# Patient Record
Sex: Female | Born: 1965 | Race: Black or African American | Hispanic: No | Smoking: Never smoker
Health system: Southern US, Community
[De-identification: ages and names within clinical notes are randomized; demographics above are authoritative.]

## PROBLEM LIST (undated history)

## (undated) DIAGNOSIS — E785 Hyperlipidemia, unspecified: Secondary | ICD-10-CM

## (undated) DIAGNOSIS — M25512 Pain in left shoulder: Secondary | ICD-10-CM

## (undated) DIAGNOSIS — M25361 Other instability, right knee: Secondary | ICD-10-CM

## (undated) DIAGNOSIS — E669 Obesity, unspecified: Secondary | ICD-10-CM

## (undated) DIAGNOSIS — I10 Essential (primary) hypertension: Secondary | ICD-10-CM

## (undated) DIAGNOSIS — T7840XA Allergy, unspecified, initial encounter: Secondary | ICD-10-CM

## (undated) DIAGNOSIS — M25362 Other instability, left knee: Secondary | ICD-10-CM

## (undated) DIAGNOSIS — J302 Other seasonal allergic rhinitis: Secondary | ICD-10-CM

## (undated) DIAGNOSIS — K219 Gastro-esophageal reflux disease without esophagitis: Secondary | ICD-10-CM

## (undated) DIAGNOSIS — J329 Chronic sinusitis, unspecified: Secondary | ICD-10-CM

## (undated) DIAGNOSIS — M25511 Pain in right shoulder: Secondary | ICD-10-CM

## (undated) HISTORY — DX: Allergy, unspecified, initial encounter: T78.40XA

## (undated) HISTORY — DX: Other instability, left knee: M25.362

## (undated) HISTORY — DX: Essential (primary) hypertension: I10

## (undated) HISTORY — DX: Hyperlipidemia, unspecified: E78.5

## (undated) HISTORY — DX: Pain in right shoulder: M25.511

## (undated) HISTORY — DX: Other seasonal allergic rhinitis: J30.2

## (undated) HISTORY — DX: Pain in left shoulder: M25.512

## (undated) HISTORY — DX: Obesity, unspecified: E66.9

## (undated) HISTORY — DX: Gastro-esophageal reflux disease without esophagitis: K21.9

## (undated) HISTORY — DX: Other instability, right knee: M25.361

---

## 1991-10-16 HISTORY — PX: TUBAL LIGATION: SHX77

## 1999-10-16 HISTORY — PX: CHOLECYSTECTOMY: SHX55

## 2015-03-08 ENCOUNTER — Encounter (HOSPITAL_COMMUNITY): Payer: Self-pay | Admitting: *Deleted

## 2015-03-08 ENCOUNTER — Emergency Department (HOSPITAL_COMMUNITY): Payer: No Typology Code available for payment source

## 2015-03-08 ENCOUNTER — Emergency Department (HOSPITAL_COMMUNITY)
Admission: EM | Admit: 2015-03-08 | Discharge: 2015-03-08 | Disposition: A | Discharge status: Home / Self Care | Payer: No Typology Code available for payment source | Attending: Emergency Medicine | Admitting: Emergency Medicine

## 2015-03-08 DIAGNOSIS — T148 Other injury of unspecified body region: Secondary | ICD-10-CM | POA: Diagnosis not present

## 2015-03-08 DIAGNOSIS — R52 Pain, unspecified: Secondary | ICD-10-CM

## 2015-03-08 DIAGNOSIS — Y9389 Activity, other specified: Secondary | ICD-10-CM | POA: Insufficient documentation

## 2015-03-08 DIAGNOSIS — S0990XA Unspecified injury of head, initial encounter: Secondary | ICD-10-CM | POA: Insufficient documentation

## 2015-03-08 DIAGNOSIS — M25551 Pain in right hip: Secondary | ICD-10-CM | POA: Diagnosis not present

## 2015-03-08 DIAGNOSIS — Y998 Other external cause status: Secondary | ICD-10-CM | POA: Insufficient documentation

## 2015-03-08 DIAGNOSIS — Y92410 Unspecified street and highway as the place of occurrence of the external cause: Secondary | ICD-10-CM | POA: Diagnosis not present

## 2015-03-08 DIAGNOSIS — T148XXA Other injury of unspecified body region, initial encounter: Secondary | ICD-10-CM

## 2015-03-08 DIAGNOSIS — S79911A Unspecified injury of right hip, initial encounter: Secondary | ICD-10-CM | POA: Diagnosis not present

## 2015-03-08 DIAGNOSIS — S4991XA Unspecified injury of right shoulder and upper arm, initial encounter: Secondary | ICD-10-CM | POA: Diagnosis present

## 2015-03-08 LAB — CBC WITH DIFFERENTIAL/PLATELET
Basophils Absolute: 0.1 10*3/uL (ref 0.0–0.1)
Basophils Relative: 1 % (ref 0–1)
EOS ABS: 0.2 10*3/uL (ref 0.0–0.7)
EOS PCT: 2 % (ref 0–5)
HCT: 38.6 % (ref 36.0–46.0)
Hemoglobin: 12.5 g/dL (ref 12.0–15.0)
LYMPHS ABS: 4.3 10*3/uL — AB (ref 0.7–4.0)
Lymphocytes Relative: 32 % (ref 12–46)
MCH: 28.3 pg (ref 26.0–34.0)
MCHC: 32.4 g/dL (ref 30.0–36.0)
MCV: 87.3 fL (ref 78.0–100.0)
MONOS PCT: 6 % (ref 3–12)
Monocytes Absolute: 0.8 10*3/uL (ref 0.1–1.0)
NEUTROS ABS: 8 10*3/uL — AB (ref 1.7–7.7)
NEUTROS PCT: 59 % (ref 43–77)
PLATELETS: 257 10*3/uL (ref 150–400)
RBC: 4.42 MIL/uL (ref 3.87–5.11)
RDW: 13 % (ref 11.5–15.5)
WBC: 13.4 10*3/uL — AB (ref 4.0–10.5)

## 2015-03-08 LAB — PROTIME-INR
INR: 1 (ref 0.00–1.49)
Prothrombin Time: 13.4 seconds (ref 11.6–15.2)

## 2015-03-08 LAB — URINE MICROSCOPIC-ADD ON

## 2015-03-08 LAB — URINALYSIS, ROUTINE W REFLEX MICROSCOPIC
BILIRUBIN URINE: NEGATIVE
Glucose, UA: NEGATIVE mg/dL
Ketones, ur: NEGATIVE mg/dL
NITRITE: NEGATIVE
PROTEIN: NEGATIVE mg/dL
Specific Gravity, Urine: 1.017 (ref 1.005–1.030)
Urobilinogen, UA: 0.2 mg/dL (ref 0.0–1.0)
pH: 6.5 (ref 5.0–8.0)

## 2015-03-08 LAB — ETHANOL: ALCOHOL ETHYL (B): 41 mg/dL — AB (ref <5)

## 2015-03-08 LAB — I-STAT CHEM 8, ED
BUN: 12 mg/dL (ref 6–20)
CHLORIDE: 103 mmol/L (ref 101–111)
Calcium, Ion: 1.11 mmol/L — ABNORMAL LOW (ref 1.12–1.23)
Creatinine, Ser: 0.8 mg/dL (ref 0.44–1.00)
Glucose, Bld: 99 mg/dL (ref 65–99)
HCT: 43 % (ref 36.0–46.0)
HEMOGLOBIN: 14.6 g/dL (ref 12.0–15.0)
POTASSIUM: 3.6 mmol/L (ref 3.5–5.1)
Sodium: 140 mmol/L (ref 135–145)
TCO2: 22 mmol/L (ref 0–100)

## 2015-03-08 MED ORDER — SODIUM CHLORIDE 0.9 % IV BOLUS (SEPSIS)
1000.0000 mL | Freq: Once | INTRAVENOUS | Status: AC
  Administered 2015-03-08: 1000 mL via INTRAVENOUS

## 2015-03-08 MED ORDER — KETOROLAC TROMETHAMINE 30 MG/ML IJ SOLN
30.0000 mg | Freq: Once | INTRAMUSCULAR | Status: AC
  Administered 2015-03-08: 30 mg via INTRAVENOUS
  Filled 2015-03-08: qty 1

## 2015-03-08 MED ORDER — FENTANYL CITRATE (PF) 100 MCG/2ML IJ SOLN
100.0000 ug | Freq: Once | INTRAMUSCULAR | Status: AC
  Administered 2015-03-08: 100 ug via INTRAVENOUS

## 2015-03-08 MED ORDER — IOHEXOL 300 MG/ML  SOLN
100.0000 mL | Freq: Once | INTRAMUSCULAR | Status: AC | PRN
  Administered 2015-03-08: 100 mL via INTRAVENOUS

## 2015-03-08 MED ORDER — METHOCARBAMOL 500 MG PO TABS: 500.0000 mg | ORAL_TABLET | Freq: Two times a day (BID) | ORAL | Status: DC

## 2015-03-08 MED ORDER — TRAMADOL HCL 50 MG PO TABS: 50.0000 mg | ORAL_TABLET | Freq: Four times a day (QID) | ORAL | Status: DC | PRN

## 2015-03-08 MED ORDER — METHOCARBAMOL 500 MG PO TABS
1000.0000 mg | ORAL_TABLET | Freq: Once | ORAL | Status: AC
  Administered 2015-03-08: 1000 mg via ORAL
  Filled 2015-03-08: qty 2

## 2015-03-08 MED ORDER — NAPROXEN 375 MG PO TABS: 375.0000 mg | ORAL_TABLET | Freq: Two times a day (BID) | ORAL | Status: DC

## 2015-03-08 MED ORDER — FENTANYL CITRATE (PF) 100 MCG/2ML IJ SOLN
INTRAMUSCULAR | Status: AC
  Filled 2015-03-08: qty 2
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
## 2015-03-08 NOTE — ED Notes (Signed)
Dr Randal Buba removed c collar
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
## 2015-03-08 NOTE — ED Notes (Signed)
Discharge instructions and prescriptions reviewed, voiced understanding. 

## 2015-03-08 NOTE — ED Notes (Signed)
Pants and slippers placed on patient  Patient up to the wheelchair without difficulty
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
## 2015-03-28 ENCOUNTER — Encounter (HOSPITAL_COMMUNITY): Payer: Self-pay | Admitting: Emergency Medicine

## 2015-03-28 ENCOUNTER — Emergency Department (HOSPITAL_COMMUNITY)
Admission: EM | Admit: 2015-03-28 | Discharge: 2015-03-28 | Disposition: A | Discharge status: Home / Self Care | Payer: No Typology Code available for payment source | Attending: Emergency Medicine | Admitting: Emergency Medicine

## 2015-03-28 DIAGNOSIS — R21 Rash and other nonspecific skin eruption: Secondary | ICD-10-CM | POA: Diagnosis not present

## 2015-03-28 DIAGNOSIS — S7011XA Contusion of right thigh, initial encounter: Secondary | ICD-10-CM

## 2015-03-28 DIAGNOSIS — Z79899 Other long term (current) drug therapy: Secondary | ICD-10-CM | POA: Diagnosis not present

## 2015-03-28 DIAGNOSIS — Z791 Long term (current) use of non-steroidal anti-inflammatories (NSAID): Secondary | ICD-10-CM | POA: Diagnosis not present

## 2015-03-28 DIAGNOSIS — M7981 Nontraumatic hematoma of soft tissue: Secondary | ICD-10-CM | POA: Diagnosis not present

## 2015-03-28 DIAGNOSIS — Z8709 Personal history of other diseases of the respiratory system: Secondary | ICD-10-CM | POA: Insufficient documentation

## 2015-03-28 HISTORY — DX: Chronic sinusitis, unspecified: J32.9

## 2015-03-28 MED ORDER — TRIAMCINOLONE ACETONIDE 0.1 % EX CREA: TOPICAL_CREAM | Freq: Two times a day (BID) | CUTANEOUS | Status: DC

## 2015-03-28 MED ORDER — TRAMADOL HCL 50 MG PO TABS: 50.0000 mg | ORAL_TABLET | Freq: Four times a day (QID) | ORAL | Status: DC | PRN

## 2015-03-28 NOTE — ED Notes (Signed)
Pt stable, ambulatory, states understanding of discharge instructions 
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
## 2015-03-28 NOTE — Discharge Instructions (Signed)
Rash A rash is a change in the color or feel of your skin. There are many different types of rashes. You may have other problems along with your rash. HOME CARE  Avoid the thing that caused your rash.  Do not scratch your rash.  You may take cools baths to help stop itching.  Only take medicines as told by your doctor.  Keep all doctor visits as told. GET HELP RIGHT AWAY IF:   Your pain, puffiness (swelling), or redness gets worse.  You have a fever.  You have new or severe problems.  You have body aches, watery poop (diarrhea), or you throw up (vomit).  Your rash is not better after 3 days. MAKE SURE YOU:   Understand these instructions.  Will watch your condition.  Will get help right away if you are not doing well or get worse. Document Released: 03/19/2008 Document Revised: 12/24/2011 Document Reviewed: 07/16/2011 [employer] Patient Information 2015 [employer]. This information is not intended to replace advice given to you by your health care provider. Make sure you discuss any questions you have with your health care provider.  
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
## 2015-05-10 ENCOUNTER — Emergency Department (HOSPITAL_COMMUNITY): Payer: Self-pay

## 2015-05-10 ENCOUNTER — Emergency Department (HOSPITAL_COMMUNITY)
Admission: EM | Admit: 2015-05-10 | Discharge: 2015-05-10 | Disposition: A | Discharge status: Home / Self Care | Payer: Self-pay | Attending: Emergency Medicine | Admitting: Emergency Medicine

## 2015-05-10 ENCOUNTER — Encounter (HOSPITAL_COMMUNITY): Payer: Self-pay | Admitting: Neurology

## 2015-05-10 DIAGNOSIS — Y92410 Unspecified street and highway as the place of occurrence of the external cause: Secondary | ICD-10-CM | POA: Insufficient documentation

## 2015-05-10 DIAGNOSIS — S299XXA Unspecified injury of thorax, initial encounter: Secondary | ICD-10-CM | POA: Insufficient documentation

## 2015-05-10 DIAGNOSIS — Y998 Other external cause status: Secondary | ICD-10-CM | POA: Insufficient documentation

## 2015-05-10 DIAGNOSIS — S4992XA Unspecified injury of left shoulder and upper arm, initial encounter: Secondary | ICD-10-CM | POA: Insufficient documentation

## 2015-05-10 DIAGNOSIS — Z79899 Other long term (current) drug therapy: Secondary | ICD-10-CM | POA: Insufficient documentation

## 2015-05-10 DIAGNOSIS — S199XXA Unspecified injury of neck, initial encounter: Secondary | ICD-10-CM | POA: Insufficient documentation

## 2015-05-10 DIAGNOSIS — M25519 Pain in unspecified shoulder: Secondary | ICD-10-CM

## 2015-05-10 DIAGNOSIS — R0789 Other chest pain: Secondary | ICD-10-CM

## 2015-05-10 DIAGNOSIS — Z8709 Personal history of other diseases of the respiratory system: Secondary | ICD-10-CM | POA: Insufficient documentation

## 2015-05-10 DIAGNOSIS — S4991XA Unspecified injury of right shoulder and upper arm, initial encounter: Secondary | ICD-10-CM | POA: Insufficient documentation

## 2015-05-10 DIAGNOSIS — M542 Cervicalgia: Secondary | ICD-10-CM

## 2015-05-10 DIAGNOSIS — Y9389 Activity, other specified: Secondary | ICD-10-CM | POA: Insufficient documentation

## 2015-05-10 LAB — CBC
HCT: 34.5 % — ABNORMAL LOW (ref 36.0–46.0)
Hemoglobin: 11.1 g/dL — ABNORMAL LOW (ref 12.0–15.0)
MCH: 28.4 pg (ref 26.0–34.0)
MCHC: 32.2 g/dL (ref 30.0–36.0)
MCV: 88.2 fL (ref 78.0–100.0)
Platelets: 235 10*3/uL (ref 150–400)
RBC: 3.91 MIL/uL (ref 3.87–5.11)
RDW: 14 % (ref 11.5–15.5)
WBC: 9.4 10*3/uL (ref 4.0–10.5)

## 2015-05-10 LAB — BASIC METABOLIC PANEL
Anion gap: 6 (ref 5–15)
BUN: 8 mg/dL (ref 6–20)
CALCIUM: 8.9 mg/dL (ref 8.9–10.3)
CO2: 29 mmol/L (ref 22–32)
Chloride: 104 mmol/L (ref 101–111)
Creatinine, Ser: 0.77 mg/dL (ref 0.44–1.00)
Glucose, Bld: 98 mg/dL (ref 65–99)
POTASSIUM: 3.6 mmol/L (ref 3.5–5.1)
SODIUM: 139 mmol/L (ref 135–145)

## 2015-05-10 LAB — I-STAT TROPONIN, ED
TROPONIN I, POC: 0 ng/mL (ref 0.00–0.08)
TROPONIN I, POC: 0 ng/mL (ref 0.00–0.08)

## 2015-05-10 MED ORDER — TRAMADOL HCL 50 MG PO TABS: 50.0000 mg | ORAL_TABLET | Freq: Four times a day (QID) | ORAL | Status: DC | PRN

## 2015-05-10 MED ORDER — CYCLOBENZAPRINE HCL 10 MG PO TABS: 10.0000 mg | ORAL_TABLET | Freq: Three times a day (TID) | ORAL | Status: DC | PRN

## 2015-05-10 NOTE — Discharge Instructions (Signed)
Your caregiver has diagnosed you as having chest pain that is not specific for one problem, but does not require admission.  You are at low risk for an acute heart condition or other serious illness. Chest pain comes from many different causes.  SEEK IMMEDIATE MEDICAL ATTENTION IF: You have severe chest pain, especially if the pain is crushing or pressure-like and spreads to the arms, back, neck, or jaw, or if you have sweating, nausea (feeling sick to your stomach), or shortness of breath. THIS IS AN EMERGENCY. Don't wait to see if the pain will go away. Get medical help at once. Call 911 or 0 (operator). DO NOT drive yourself to the hospital.  Your chest pain gets worse and does not go away with rest.  You have an attack of chest pain lasting longer than usual, despite rest and treatment with the medications your caregiver has prescribed.  You wake from sleep with chest pain or shortness of breath.  You feel dizzy or faint.  You have chest pain not typical of your usual pain for which you originally saw your caregiver.   Heat Therapy Heat therapy can help make painful, stiff muscles and joints feel better. Do not use heat on new injuries. Wait at least 48 hours after an injury to use heat. Do not use heat when you have aches or pains right after an activity. If you still have pain 3 hours after stopping the activity, then you may use heat. HOME CARE Wet heat pack  Soak a clean towel in warm water. Squeeze out the extra water.  Put the warm, wet towel in a plastic bag.  Place a thin, dry towel between your skin and the bag.  Put the heat pack on the area for 5 minutes, and check your skin. Your skin may be pink, but it should not be red.  Leave the heat pack on the area for 15 to 30 minutes.  Repeat this every 2 to 4 hours while awake. Do not use heat while you are sleeping. Warm water bath  Fill a tub with warm water.  Place the affected body part in the tub.  Soak the area for 20  to 40 minutes.  Repeat as needed. Hot water bottle  Fill the water bottle half full with hot water.  Press out the extra air. Close the cap tightly.  Place a dry towel between your skin and the bottle.  Put the bottle on the area for 5 minutes, and check your skin. Your skin may be pink, but it should not be red.  Leave the bottle on the area for 15 to 30 minutes.  Repeat this every 2 to 4 hours while awake. Electric heating pad  Place a dry towel between your skin and the heating pad.  Set the heating pad on low heat.  Put the heating pad on the area for 10 minutes, and check your skin. Your skin may be pink, but it should not be red.  Leave the heating pad on the area for 20 to 40 minutes.  Repeat this every 2 to 4 hours while awake.  Do not lie on the heating pad.  Do not fall asleep while using the heating pad.  Do not use the heating pad near water. GET HELP RIGHT AWAY IF:  You get blisters or red skin.  Your skin is puffy (swollen), or you lose feeling (numbness) in the affected area.  You have any new problems.  Your problems are  getting worse.  You have any questions or concerns. If you have any problems, stop using heat therapy until you see your doctor. MAKE SURE YOU:  Understand these instructions.  Will watch your condition.  Will get help right away if you are not doing well or get worse. Document Released: 12/24/2011 Document Reviewed: 11/24/2013 [facility] Patient Information 2015 [place]. This information is not intended to replace advice given to you by your health care provider. Make sure you discuss any questions you have with your health care provider.  Chest Pain (Nonspecific) It is often hard to give a specific diagnosis for the cause of chest pain. There is always a chance that your pain could be related to something serious, such as a heart attack or a blood clot in the lungs. You need to follow up with your health care provider  for further evaluation. CAUSES   Heartburn.  Pneumonia or bronchitis.  Anxiety or stress.  Inflammation around your heart (pericarditis) or lung (pleuritis or pleurisy).  A blood clot in the lung.  A collapsed lung (pneumothorax). It can develop suddenly on its own (spontaneous pneumothorax) or from trauma to the chest.  Shingles infection (herpes zoster virus). The chest wall is composed of bones, muscles, and cartilage. Any of these can be the source of the pain.  The bones can be bruised by injury.  The muscles or cartilage can be strained by coughing or overwork.  The cartilage can be affected by inflammation and become sore (costochondritis). DIAGNOSIS  Lab tests or other studies may be needed to find the cause of your pain. Your health care provider may have you take a test called an ambulatory electrocardiogram (ECG). An ECG records your heartbeat patterns over a 24-hour period. You may also have other tests, such as:  Transthoracic echocardiogram (TTE). During echocardiography, sound waves are used to evaluate how blood flows through your heart.  Transesophageal echocardiogram (TEE).  Cardiac monitoring. This allows your health care provider to monitor your heart rate and rhythm in real time.  Holter monitor. This is a portable device that records your heartbeat and can help diagnose heart arrhythmias. It allows your health care provider to track your heart activity for several days, if needed.  Stress tests by exercise or by giving medicine that makes the heart beat faster. TREATMENT   Treatment depends on what may be causing your chest pain. Treatment may include:  Acid blockers for heartburn.  Anti-inflammatory medicine.  Pain medicine for inflammatory conditions.  Antibiotics if an infection is present.  You may be advised to change lifestyle habits. This includes stopping smoking and avoiding alcohol, caffeine, and chocolate.  You may be advised to keep  your head raised (elevated) when sleeping. This reduces the chance of acid going backward from your stomach into your esophagus. Most of the time, nonspecific chest pain will improve within 2-3 days with rest and mild pain medicine.  HOME CARE INSTRUCTIONS   If antibiotics were prescribed, take them as directed. Finish them even if you start to feel better.  For the next few days, avoid physical activities that bring on chest pain. Continue physical activities as directed.  Do not use any tobacco products, including cigarettes, chewing tobacco, or electronic cigarettes.  Avoid drinking alcohol.  Only take medicine as directed by your health care provider.  Follow your health care provider's suggestions for further testing if your chest pain does not go away.  Keep any follow-up appointments you made. If you do not  go to an appointment, you could develop lasting (chronic) problems with pain. If there is any problem keeping an appointment, call to reschedule. SEEK MEDICAL CARE IF:   Your chest pain does not go away, even after treatment.  You have a rash with blisters on your chest.  You have a fever. SEEK IMMEDIATE MEDICAL CARE IF:   You have increased chest pain or pain that spreads to your arm, neck, jaw, back, or abdomen.  You have shortness of breath.  You have an increasing cough, or you cough up blood.  You have severe back or abdominal pain.  You feel nauseous or vomit.  You have severe weakness.  You faint.  You have chills. This is an emergency. Do not wait to see if the pain will go away. Get medical help at once. Call your local emergency services (911 in [place] Do not drive yourself to the hospital. MAKE SURE YOU:   Understand these instructions.  Will watch your condition.  Will get help right away if you are not doing well or get worse. Document Released: 07/11/2005 Document Revised: 10/06/2013 Document Reviewed: 05/06/2008 [facility] Patient Information  2015 [place], [place]. This information is not intended to replace advice given to you by your health care provider. Make sure you discuss any questions you have with your health care provider.    Musculoskeletal Pain Musculoskeletal pain is muscle and boney aches and pains. These pains can occur in any part of the body. Your caregiver may treat you without knowing the cause of the pain. They may treat you if blood or urine tests, X-rays, and other tests were normal.  CAUSES There is often not a definite cause or reason for these pains. These pains may be caused by a type of germ (virus). The discomfort may also come from overuse. Overuse includes working out too hard when your body is not fit. Boney aches also come from weather changes. Bone is sensitive to atmospheric pressure changes. HOME CARE INSTRUCTIONS   Ask when your test results will be ready. Make sure you get your test results.  Only take over-the-counter or prescription medicines for pain, discomfort, or fever as directed by your caregiver. If you were given medications for your condition, do not drive, operate machinery or power tools, or sign legal documents for 24 hours. Do not drink alcohol. Do not take sleeping pills or other medications that may interfere with treatment.  Continue all activities unless the activities cause more pain. When the pain lessens, slowly resume normal activities. Gradually increase the intensity and duration of the activities or exercise.  During periods of severe pain, bed rest may be helpful. Lay or sit in any position that is comfortable.  Putting ice on the injured area.  Put ice in a bag.  Place a towel between your skin and the bag.  Leave the ice on for 15 to 20 minutes, 3 to 4 times a day.  Follow up with your caregiver for continued problems and no reason can be found for the pain. If the pain becomes worse or does not go away, it may be necessary to repeat tests or do additional testing.  Your caregiver may need to look further for a possible cause. SEEK IMMEDIATE MEDICAL CARE IF:  You have pain that is getting worse and is not relieved by medications.  You develop chest pain that is associated with shortness or breath, sweating, feeling sick to your stomach (nauseous), or throw up (vomit).  Your pain becomes  localized to the abdomen.  You develop any new symptoms that seem different or that concern you. MAKE SURE YOU:   Understand these instructions.  Will watch your condition.  Will get help right away if you are not doing well or get worse. Document Released: 10/01/2005 Document Revised: 12/24/2011 Document Reviewed: 06/05/2013 [facility] Patient Information 2015 [place], [place]. This information is not intended to replace advice given to you by your health care provider. Make sure you discuss any questions you have with your health care provider.
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
## 2015-05-10 NOTE — ED Notes (Signed)
Patient transported to X-ray 
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
## 2015-05-16 ENCOUNTER — Inpatient Hospital Stay: Payer: Self-pay | Admitting: Family Medicine

## 2016-01-20 ENCOUNTER — Ambulatory Visit (HOSPITAL_COMMUNITY)
Admission: EM | Admit: 2016-01-20 | Discharge: 2016-01-20 | Disposition: A | Discharge status: Home / Self Care | Payer: No Typology Code available for payment source | Attending: Emergency Medicine | Admitting: Emergency Medicine

## 2016-01-20 ENCOUNTER — Encounter (HOSPITAL_COMMUNITY): Payer: Self-pay | Admitting: Emergency Medicine

## 2016-01-20 DIAGNOSIS — M25512 Pain in left shoulder: Secondary | ICD-10-CM

## 2016-01-20 DIAGNOSIS — M25511 Pain in right shoulder: Secondary | ICD-10-CM

## 2016-01-20 MED ORDER — TRAMADOL HCL 50 MG PO TABS: 50.0000 mg | ORAL_TABLET | Freq: Four times a day (QID) | ORAL | Status: DC | PRN

## 2016-01-20 NOTE — ED Notes (Signed)
PT reports bilateral shoulder muscle spasms. PT reports she has had problems with her neck and shoulders for years. PT reports this episode has been going on for a few days.

## 2016-01-20 NOTE — ED Notes (Signed)
Provider at bedside
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
## 2016-01-20 NOTE — Discharge Instructions (Signed)
Heat Therapy °Heat therapy can help ease sore, stiff, injured, and tight muscles and joints. Heat relaxes your muscles, which may help ease your pain. Heat therapy should only be used on old, pre-existing, or long-lasting (chronic) injuries. Do not use heat therapy unless told by your doctor. °HOW TO USE HEAT THERAPY °There are several different kinds of heat therapy, including: °· Moist heat pack. °· Warm water bath. °· Hot water bottle. °· Electric heating pad. °· Heated gel pack. °· Heated wrap. °· Electric heating pad. °GENERAL HEAT THERAPY RECOMMENDATIONS  °· Do not sleep while using heat therapy. Only use heat therapy while you are awake. °· Your skin may turn pink while using heat therapy. Do not use heat therapy if your skin turns red. °· Do not use heat therapy if you have new pain. °· High heat or long exposure to heat can cause burns. Be careful when using heat therapy to avoid burning your skin. °· Do not use heat therapy on areas of your skin that are already irritated, such as with a rash or sunburn. °GET HELP IF:  °· You have blisters, redness, swelling (puffiness), or numbness. °· You have new pain. °· Your pain is worse. °MAKE SURE YOU: °· Understand these instructions. °· Will watch your condition. °· Will get help right away if you are not doing well or get worse. °  °This information is not intended to replace advice given to you by your health care provider. Make sure you discuss any questions you have with your health care provider. °  °Document Released: 12/24/2011 Document Revised: 10/22/2014 Document Reviewed: 11/24/2013 °Elsevier Interactive Patient Education ©2016 [employer] Inc[employer] ° °

## 2016-08-13 ENCOUNTER — Encounter (HOSPITAL_COMMUNITY): Payer: Self-pay | Admitting: Emergency Medicine

## 2016-08-13 ENCOUNTER — Ambulatory Visit (HOSPITAL_COMMUNITY)
Admission: EM | Admit: 2016-08-13 | Discharge: 2016-08-13 | Disposition: A | Discharge status: Home / Self Care | Payer: Self-pay | Attending: Emergency Medicine | Admitting: Emergency Medicine

## 2016-08-13 DIAGNOSIS — J00 Acute nasopharyngitis [common cold]: Secondary | ICD-10-CM

## 2016-08-13 MED ORDER — IPRATROPIUM BROMIDE 0.06 % NA SOLN: 2.0000 | Freq: Four times a day (QID) | NASAL | 12 refills | Status: DC

## 2016-08-13 MED ORDER — AZITHROMYCIN 250 MG PO TABS: 250.0000 mg | ORAL_TABLET | Freq: Every day | ORAL | 0 refills | Status: DC

## 2016-08-13 NOTE — ED Provider Notes (Signed)
CSN: [id number]     Arrival date & time 08/13/16  1029 History   First MD Initiated Contact with Patient 08/13/16 1158     Chief Complaint  Patient presents with  . Generalized Body Aches   (Consider location/radiation/quality/duration/timing/severity/associated sxs/prior Treatment) Patient c/o uri sx's for over 3 weeks.  She states she is getting some nasal congestion and sore throat and some Facial pressure.  She state she feels weak.  She states she is taking otc decongestants.  She does not have PCP. She is not taking BP medicine.    Cough  Cough characteristics:  Non-productive Sputum characteristics:  Yellow Severity:  Moderate Onset quality:  Sudden Duration:  3 weeks Timing:  Constant Progression:  Waxing and waning Chronicity:  New Smoker: no   Context: weather changes   Relieved by:  Nothing Worsened by:  Nothing Ineffective treatments:  Decongestant Associated symptoms: sinus congestion     Past Medical History:  Diagnosis Date  . Sinusitis    Past Surgical History:  Procedure Laterality Date  . CHOLECYSTECTOMY     History reviewed. No pertinent family history. Social History  Substance Use Topics  . Smoking status: Never Smoker  . Smokeless tobacco: Never Used  . Alcohol use No   OB History    No data available     Review of Systems  Constitutional: Negative.   HENT: Negative.   Eyes: Negative.   Respiratory: Positive for cough.   Cardiovascular: Negative.   Gastrointestinal: Negative.   Endocrine: Negative.   Genitourinary: Negative.   Musculoskeletal: Negative.   Skin: Negative.   Allergic/Immunologic: Negative.   Neurological: Negative.   Hematological: Negative.   Psychiatric/Behavioral: Negative.     Allergies  Naproxen and Robaxin [methocarbamol]  Home Medications   Prior to Admission medications   Medication Sig Start Date End Date Taking? Authorizing Provider  cyclobenzaprine (FLEXERIL) 10 MG tablet Take 1 tablet (10 mg  total) by mouth 3 (three) times daily as needed for muscle spasms. 05/10/15  Yes Margarita Mail, PA-C  Aspirin-Acetaminophen-Caffeine (GOODY HEADACHE PO) Take 1 packet by mouth daily as needed (general pain).    Historical Provider, MD  azithromycin (ZITHROMAX) 250 MG tablet Take 1 tablet (250 mg total) by mouth daily. Take first 2 tablets together, then 1 every day until finished. 08/13/16   Lysbeth Penner, FNP  ibuprofen (ADVIL,MOTRIN) 200 MG tablet Take 200 mg by mouth every 6 (six) hours as needed for moderate pain.    Historical Provider, MD  ipratropium (ATROVENT) 0.06 % nasal spray Place 2 sprays into both nostrils 4 (four) times daily. 08/13/16   Lysbeth Penner, FNP  traMADol (ULTRAM) 50 MG tablet Take 1 tablet (50 mg total) by mouth every 6 (six) hours as needed. 05/10/15   Margarita Mail, PA-C  traMADol (ULTRAM) 50 MG tablet Take 1 tablet (50 mg total) by mouth every 6 (six) hours as needed. 01/20/16   Konrad Felix, PA  vitamin B-12 (CYANOCOBALAMIN) 500 MCG tablet Take 1,000 mcg by mouth daily.    Historical Provider, MD   Meds Ordered and Administered this Visit  Medications - No data to display  BP 178/100 (BP Location: Right Arm)   Pulse 62   Temp 98.1 F (36.7 C) (Oral)   Resp 20   SpO2 100%  No data found.   Physical Exam  Constitutional: She appears well-developed and well-nourished.  HENT:  Head: Normocephalic and atraumatic.  Right Ear: External ear normal.  Left Ear: External ear normal.  Mouth/Throat: Oropharynx is clear and moist.  Eyes: Conjunctivae and EOM are normal. Pupils are equal, round, and reactive to light.  Neck: Normal range of motion. Neck supple.  Cardiovascular: Normal rate, regular rhythm and normal heart sounds.   Pulmonary/Chest: Effort normal and breath sounds normal.  Abdominal: Soft. Bowel sounds are normal.  Nursing note and vitals reviewed.   Urgent Care Course   Clinical Course    Procedures (including critical care  time)  Labs Review Labs Reviewed - No data to display  Imaging Review No results found.   Visual Acuity Review  Right Eye Distance:   Left Eye Distance:   Bilateral Distance:    Right Eye Near:   Left Eye Near:    Bilateral Near:         MDM   1. Acute nasopharyngitis    Atrovent nasal spray 0.06% 2 sprays per nostril qd #15 ml Zithromax 250mg  2 po first day and then one po qd x 4 days #6 Push po fluids, rest, tylenol and motrin otc prn as directed for fever, arthralgias, and myalgias.  Follow up prn if sx's continue or persist.    Lysbeth Penner, FNP 08/13/16 [phone]

## 2016-08-13 NOTE — ED Triage Notes (Signed)
The patient presented to the [facility] with a complaint of general body aches and chest congestion x 3 weeks. The patient stated that she has been taking OTC meds without much relief.

## 2016-09-17 ENCOUNTER — Emergency Department (HOSPITAL_COMMUNITY)
Admission: EM | Admit: 2016-09-17 | Discharge: 2016-09-17 | Disposition: A | Discharge status: Home / Self Care | Payer: Self-pay | Attending: Physician Assistant | Admitting: Physician Assistant

## 2016-09-17 ENCOUNTER — Encounter (HOSPITAL_COMMUNITY): Payer: Self-pay | Admitting: *Deleted

## 2016-09-17 DIAGNOSIS — M25562 Pain in left knee: Secondary | ICD-10-CM | POA: Insufficient documentation

## 2016-09-17 DIAGNOSIS — Z7982 Long term (current) use of aspirin: Secondary | ICD-10-CM | POA: Insufficient documentation

## 2016-09-17 DIAGNOSIS — M62838 Other muscle spasm: Secondary | ICD-10-CM | POA: Insufficient documentation

## 2016-09-17 MED ORDER — CYCLOBENZAPRINE HCL 10 MG PO TABS: 10.0000 mg | ORAL_TABLET | Freq: Two times a day (BID) | ORAL | 0 refills | Status: DC | PRN

## 2016-09-17 NOTE — ED Triage Notes (Signed)
Pt states bil neck pain that increases when she moves.  Describes pain as stiff and has been taking muscle relaxers with minimal relief.  Moving head back and forth in triage without difficulty. Also c/o L knee pain for quite some time.

## 2016-09-17 NOTE — ED Provider Notes (Signed)
[facility] DEPT Provider Note   CSN: [id number] Arrival date & time: 09/17/16  1055  By signing my name below, I, Soijett Blue, attest that this documentation has been prepared under the direction and in the presence of Cortnie Ringel Lyn Chistopher Mangino, MD. Electronically Signed: Soijett Blue, ED Scribe. 09/17/16. 12:30 PM.  History   Chief Complaint Chief Complaint  Patient presents with  . Neck Pain  . Knee Pain    HPI Sheryl Martin is a 50 y.o. female who presents to the Emergency Department complaining of bilateral neck pain onset 2 months. Pt describes her neck pain as a stiffness sensation and she bought new pillows to attempt to alleviate her symptoms. Pt reports that she has muscle spasms to her bilateral shoulders. Pt states that she was seen at an Urgent Care and was given muscle relaxers. Pt notes that her bilateral sided neck pain is worsened with movement and denies alleviating factors for her neck pain. She notes that she has tried Rx muscle relaxers with no relief of her symptoms. She denies fever, chills, and any other symptoms. Pt denies having a PCP at this time.   Pt secondarily complains of left knee pain. Pt states that her left knee "shifts" while ambulating. Pt denies injury or trauma. Pt hasn't tried any medications for the relief of her symptoms. Pt denies joint swelling, and any other symptoms.   The history is provided by the patient. No language interpreter was used.    Past Medical History:  Diagnosis Date  . Sinusitis     There are no active problems to display for this patient.   Past Surgical History:  Procedure Laterality Date  . CHOLECYSTECTOMY      OB History    No data available       Home Medications    Prior to Admission medications   Medication Sig Start Date End Date Taking? Authorizing Provider  Aspirin-Acetaminophen-Caffeine (GOODY HEADACHE PO) Take 1 packet by mouth daily as needed (general pain).    Historical Provider, MD    azithromycin (ZITHROMAX) 250 MG tablet Take 1 tablet (250 mg total) by mouth daily. Take first 2 tablets together, then 1 every day until finished. 08/13/16   Lysbeth Penner, FNP  cyclobenzaprine (FLEXERIL) 10 MG tablet Take 1 tablet (10 mg total) by mouth 3 (three) times daily as needed for muscle spasms. 05/10/15   Margarita Mail, PA-C  ibuprofen (ADVIL,MOTRIN) 200 MG tablet Take 200 mg by mouth every 6 (six) hours as needed for moderate pain.    Historical Provider, MD  ipratropium (ATROVENT) 0.06 % nasal spray Place 2 sprays into both nostrils 4 (four) times daily. 08/13/16   Lysbeth Penner, FNP  traMADol (ULTRAM) 50 MG tablet Take 1 tablet (50 mg total) by mouth every 6 (six) hours as needed. 05/10/15   Margarita Mail, PA-C  traMADol (ULTRAM) 50 MG tablet Take 1 tablet (50 mg total) by mouth every 6 (six) hours as needed. 01/20/16   Konrad Felix, PA  vitamin B-12 (CYANOCOBALAMIN) 500 MCG tablet Take 1,000 mcg by mouth daily.    Historical Provider, MD    Family History No family history on file.  Social History Social History  Substance Use Topics  . Smoking status: Never Smoker  . Smokeless tobacco: Never Used  . Alcohol use No     Allergies   Naproxen and Robaxin [methocarbamol]   Review of Systems Review of Systems  Constitutional: Negative for chills and fever.  Musculoskeletal: Positive for  arthralgias (left knee). Negative for joint swelling.     Physical Exam Updated Vital Signs BP 177/90 (BP Location: Left Arm)   Pulse 71   Temp 98.3 F (36.8 C) (Oral)   Resp 16   SpO2 100%   Physical Exam  Constitutional: She is oriented to person, place, and time. She appears well-developed and well-nourished. No distress.  HENT:  Head: Normocephalic and atraumatic.  Eyes: EOM are normal.  Neck: Neck supple.  No cervical midline tenderness. Tight muscles in bilateral trapezius.   Cardiovascular: Normal rate, regular rhythm and normal heart sounds.  Exam reveals no  gallop and no friction rub.   No murmur heard. Pulmonary/Chest: Effort normal and breath sounds normal. No respiratory distress. She has no wheezes. She has no rales.  Abdominal: She exhibits no distension.  Musculoskeletal: Normal range of motion.       Left knee: She exhibits normal range of motion.  Good ROM in left knee. Good BUE sensation and strength.   Neurological: She is alert and oriented to person, place, and time. No cranial nerve deficit.  Cranial nerves 2-12 intact.   Skin: Skin is warm and dry.  Psychiatric: She has a normal mood and affect. Her behavior is normal.  Nursing note and vitals reviewed.    ED Treatments / Results  DIAGNOSTIC STUDIES: Oxygen Saturation is 100% on RA, nl by my interpretation.    COORDINATION OF CARE: 12:15 PM Discussed treatment plan with pt at bedside which includes referral to [facility], knee sleeve, flexeril Rx and pt agreed to plan.   Procedures Procedures (including critical care time)  Medications Ordered in ED Medications - No data to display   Initial Impression / Assessment and Plan / ED Course  I have reviewed the triage vital signs and the nursing notes.  Clinical Course   Patient is a well-appearing 50 year old female normal vital signs. She is presenting with chronic neck spasms. She would like a refill on her muscle relaxants. We will give her refill, and a referral to [facility] she does not have insurance at this time. Patient also complains of chronic knee "shakiness". Patient has full range of motion and is able to ambulate without issue. No fevers. She Has noted no weakness in bilateral upper or lower extremities. We will give her knee sleeve, referral to primary care.  Final Clinical Impressions(s) / ED Diagnoses   Final diagnoses:  None    New Prescriptions New Prescriptions   No medications on file   I personally performed the services described in this documentation, which was  scribed in my presence. The recorded information has been reviewed and is accurate.      Antwon Rochin Julio Alm, MD 09/17/16 1234

## 2016-09-17 NOTE — Discharge Instructions (Signed)
You were seen with chronic neck spasms.  We refilled your perscribption and have given you a referall to [facility].  To find a primary care or specialty doctor please call [phone] or [phone] to access "[facility]."  You may also go on the [facility] website at [email or web address]  There are also multiple [facility] and [facility] practices throughout the [place] that are frequently accepting new patients. You may find a clinic that is close to your home and contact them.  [facility] -  [address] [place] [phone]  [facility] in [place] (also locations in [place] and [place]) -  [place] [address] [address]  [place] [address] [phone]

## 2016-10-15 DIAGNOSIS — I10 Essential (primary) hypertension: Secondary | ICD-10-CM

## 2016-10-15 DIAGNOSIS — E669 Obesity, unspecified: Secondary | ICD-10-CM

## 2016-10-15 HISTORY — DX: Essential (primary) hypertension: I10

## 2016-10-15 HISTORY — DX: Obesity, unspecified: E66.9

## 2016-11-12 ENCOUNTER — Encounter: Payer: Self-pay | Admitting: Internal Medicine

## 2016-11-12 ENCOUNTER — Ambulatory Visit (INDEPENDENT_AMBULATORY_CARE_PROVIDER_SITE_OTHER): Payer: Self-pay | Admitting: Internal Medicine

## 2016-11-12 VITALS — BP 160/90 | HR 76 | Resp 12 | Ht 61.5 in | Wt 199.0 lb

## 2016-11-12 DIAGNOSIS — K219 Gastro-esophageal reflux disease without esophagitis: Secondary | ICD-10-CM

## 2016-11-12 DIAGNOSIS — M542 Cervicalgia: Secondary | ICD-10-CM

## 2016-11-12 DIAGNOSIS — I10 Essential (primary) hypertension: Secondary | ICD-10-CM

## 2016-11-12 DIAGNOSIS — E669 Obesity, unspecified: Secondary | ICD-10-CM

## 2016-11-12 MED ORDER — CYCLOBENZAPRINE HCL 10 MG PO TABS: 10.0000 mg | ORAL_TABLET | Freq: Two times a day (BID) | ORAL | 0 refills | Status: DC | PRN

## 2016-11-12 MED ORDER — FAMOTIDINE 20 MG PO TABS: ORAL_TABLET | ORAL | 3 refills | Status: DC
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
## 2016-11-12 NOTE — Patient Instructions (Signed)
Drink a glass of water before every meal Drink 6-8 glasses of water daily Eat three meals daily Eat a protein and healthy fat with every meal (eggs,fish, chicken, turkey and limit red meats) Eat 5 servings of vegetables daily, mix the colors Eat 2 servings of fruit daily with skin, if skin is edible Use smaller plates Put food/utensils down as you chew and swallow each bite Eat at a table with friends/family at least once daily, no TV Do not eat in front of the TV 

## 2016-11-20 ENCOUNTER — Other Ambulatory Visit (INDEPENDENT_AMBULATORY_CARE_PROVIDER_SITE_OTHER): Payer: Self-pay

## 2016-11-20 DIAGNOSIS — E669 Obesity, unspecified: Secondary | ICD-10-CM

## 2016-11-20 DIAGNOSIS — Z862 Personal history of diseases of the blood and blood-forming organs and certain disorders involving the immune mechanism: Secondary | ICD-10-CM

## 2016-11-21 LAB — LIPID PANEL W/O CHOL/HDL RATIO
Cholesterol, Total: 229 mg/dL — ABNORMAL HIGH (ref 100–199)
HDL: 55 mg/dL (ref >39)
LDL Calculated: 155 mg/dL — ABNORMAL HIGH (ref 0–99)
Triglycerides: 95 mg/dL (ref 0–149)
VLDL Cholesterol Cal: 19 mg/dL (ref 5–40)

## 2016-11-21 LAB — CBC WITH DIFFERENTIAL/PLATELET
Basophils Absolute: 0.1 10*3/uL (ref 0.0–0.2)
Basos: 1 %
EOS (ABSOLUTE): 0.1 10*3/uL (ref 0.0–0.4)
Eos: 1 %
HEMOGLOBIN: 11.8 g/dL (ref 11.1–15.9)
Hematocrit: 37.3 % (ref 34.0–46.6)
IMMATURE GRANS (ABS): 0 10*3/uL (ref 0.0–0.1)
IMMATURE GRANULOCYTES: 0 %
Lymphocytes Absolute: 2.9 10*3/uL (ref 0.7–3.1)
Lymphs: 34 %
MCH: 27.9 pg (ref 26.6–33.0)
MCHC: 31.6 g/dL (ref 31.5–35.7)
MCV: 88 fL (ref 79–97)
Monocytes Absolute: 0.9 10*3/uL (ref 0.1–0.9)
Monocytes: 10 %
NEUTROS ABS: 4.6 10*3/uL (ref 1.4–7.0)
Neutrophils: 54 %
PLATELETS: 262 10*3/uL (ref 150–379)
RBC: 4.23 x10E6/uL (ref 3.77–5.28)
RDW: 14 % (ref 12.3–15.4)
WBC: 8.5 10*3/uL (ref 3.4–10.8)

## 2016-11-21 LAB — COMPREHENSIVE METABOLIC PANEL
A/G RATIO: 1.5 (ref 1.2–2.2)
ALBUMIN: 4.5 g/dL (ref 3.5–5.5)
ALK PHOS: 77 IU/L (ref 39–117)
ALT: 35 IU/L — ABNORMAL HIGH (ref 0–32)
AST: 24 IU/L (ref 0–40)
BILIRUBIN TOTAL: 0.6 mg/dL (ref 0.0–1.2)
BUN/Creatinine Ratio: 10 (ref 9–23)
BUN: 8 mg/dL (ref 6–24)
CHLORIDE: 99 mmol/L (ref 96–106)
CO2: 27 mmol/L (ref 18–29)
Calcium: 9.5 mg/dL (ref 8.7–10.2)
Creatinine, Ser: 0.77 mg/dL (ref 0.57–1.00)
GFR calc Af Amer: 103 mL/min/{1.73_m2} (ref >59)
GFR calc non Af Amer: 89 mL/min/{1.73_m2} (ref >59)
GLOBULIN, TOTAL: 3 g/dL (ref 1.5–4.5)
Glucose: 75 mg/dL (ref 65–99)
Potassium: 4.7 mmol/L (ref 3.5–5.2)
SODIUM: 141 mmol/L (ref 134–144)
Total Protein: 7.5 g/dL (ref 6.0–8.5)

## 2016-12-13 NOTE — Progress Notes (Signed)
Left message for patient to call back to schedule appointment.

## 2017-01-14 ENCOUNTER — Other Ambulatory Visit (INDEPENDENT_AMBULATORY_CARE_PROVIDER_SITE_OTHER): Payer: Self-pay

## 2017-01-14 ENCOUNTER — Encounter: Payer: Self-pay | Admitting: Internal Medicine

## 2017-01-14 ENCOUNTER — Other Ambulatory Visit: Payer: Self-pay | Admitting: Internal Medicine

## 2017-01-14 ENCOUNTER — Ambulatory Visit (INDEPENDENT_AMBULATORY_CARE_PROVIDER_SITE_OTHER): Payer: Self-pay | Admitting: Internal Medicine

## 2017-01-14 VITALS — BP 160/100 | HR 64 | Resp 12 | Ht 61.25 in | Wt 190.0 lb

## 2017-01-14 DIAGNOSIS — E782 Mixed hyperlipidemia: Secondary | ICD-10-CM

## 2017-01-14 DIAGNOSIS — I10 Essential (primary) hypertension: Secondary | ICD-10-CM

## 2017-01-14 DIAGNOSIS — R748 Abnormal levels of other serum enzymes: Secondary | ICD-10-CM

## 2017-01-14 DIAGNOSIS — T7840XA Allergy, unspecified, initial encounter: Secondary | ICD-10-CM

## 2017-01-14 DIAGNOSIS — Z1239 Encounter for other screening for malignant neoplasm of breast: Secondary | ICD-10-CM

## 2017-01-14 DIAGNOSIS — E6609 Other obesity due to excess calories: Secondary | ICD-10-CM

## 2017-01-14 DIAGNOSIS — Z23 Encounter for immunization: Secondary | ICD-10-CM

## 2017-01-14 DIAGNOSIS — Z0000 Encounter for general adult medical examination without abnormal findings: Secondary | ICD-10-CM

## 2017-01-14 DIAGNOSIS — Z124 Encounter for screening for malignant neoplasm of cervix: Secondary | ICD-10-CM

## 2017-01-14 DIAGNOSIS — Z1231 Encounter for screening mammogram for malignant neoplasm of breast: Secondary | ICD-10-CM

## 2017-01-14 DIAGNOSIS — E010 Iodine-deficiency related diffuse (endemic) goiter: Secondary | ICD-10-CM

## 2017-01-14 DIAGNOSIS — IMO0001 Reserved for inherently not codable concepts without codable children: Secondary | ICD-10-CM

## 2017-01-14 DIAGNOSIS — Z6835 Body mass index (BMI) 35.0-35.9, adult: Secondary | ICD-10-CM

## 2017-01-14 LAB — POCT WET PREP WITH KOH
KOH PREP POC: NEGATIVE
RBC WET PREP PER HPF POC: NEGATIVE
Trichomonas, UA: NEGATIVE
Yeast Wet Prep HPF POC: NEGATIVE

## 2017-01-14 MED ORDER — AMLODIPINE BESYLATE 5 MG PO TABS: 5.0000 mg | ORAL_TABLET | Freq: Every day | ORAL | 11 refills | Status: DC

## 2017-01-14 MED ORDER — CETIRIZINE HCL 10 MG PO TABS: 10.0000 mg | ORAL_TABLET | Freq: Every day | ORAL | 11 refills | Status: DC

## 2017-01-14 NOTE — Patient Instructions (Signed)
Can google "advance directives, [place]"  And bring up form from Secretary of State. Print and fill out Or can go to "5 wishes"  Which is also in Spanish and fill out--this costs $5--perhaps easier to use. Designate a Medical Power of Attorney to speak for you if you are unable to speak for yourself when ill or injured  

## 2017-01-14 NOTE — Progress Notes (Signed)
Subjective:    Patient ID: Sheryl Martin, female    DOB: [birth year], 51 y.o.   MRN: [id number]  HPI   CPE with pap  1.  Pap:  2008 was last.  Always normal.  No family history.  2.  Mammogram:  2008 was last.  Always normal.  No family history.  3.  Osteoprevention:  Does like being outside in sun.  Outside weight bearing, walking.  Does like walking as well.  Does swim.  No family history.  4.  Guaiac Cards: She may have done this before.  No family history.   5.  Colonoscopy:  Describes having this done in [place].  States everything was okay.  Not clear why this was done. Had EGD same time.  Sounds like had blood in stool at the time.  No family history.  6.  Immunizations:  Cannot recall last Td.  Did not get a flu vaccine this past year.    7.  Cholesterol/glucose:  Glucose 75 fasting 11/20/2016. Lipid Panel     Component Value Date/Time   CHOL 229 (H) 11/20/2016 1023   TRIG 95 11/20/2016 1023   HDL 55 11/20/2016 1023   LDLCALC 155 (H) 11/20/2016 1023   Concerns:   1.   Elevated BP:  States her bps at home are high as well.  Has lost 9 lbs since here in February.  Never on medication for bp before.  Has been drinking lots of water and eating better.    2.  Seasonal allergies:  Generally takes Loratadine, but would like to try lower cost Zyrtec.  Sneezing with pollen.  Current Meds  Medication Sig  . famotidine (PEPCID) 20 MG tablet 2 tabs by mouth at bedtime   Allergies  Allergen Reactions  . Naproxen   . Robaxin [Methocarbamol]     Past Medical History:  Diagnosis Date  . Allergy    seasonal  . Hypertension 2018  . Obesity 2018  . Sinusitis     Past Surgical History:  Procedure Laterality Date  . CHOLECYSTECTOMY  2001   laparoscopic  . TUBAL LIGATION  1993    Family History  Problem Relation Age of Onset  . Hypertension Mother   . Stroke Mother   . Cancer Father     lung cancer--smoker  . Hypertension Sister   . Hypertension Brother   .  Diabetes Brother   . Diabetes Sister   . Hypertension Sister     peripheral neuropathy  . Peripheral vascular disease Sister   . Hypertension Sister   . Seizures Sister   . Hypertension Sister   . Hypertension Sister   . Hypertension Sister   . Hypertension Sister   . Diabetes Sister   . Hypertension Brother   . Alcohol abuse Brother   . Cirrhosis Brother   . Hypertension Brother     Social History   Social History  . Marital status: Single    Spouse name: N/A  . Number of children: 2  . Years of education: 11   Occupational History  . packaging at [employer]    Social History Main Topics  . Smoking status: Never Smoker  . Smokeless tobacco: Never Used  . Alcohol use Yes     Comment: rare  . Drug use: No  . Sexual activity: Not Currently    Birth control/ protection: Post-menopausal   Other Topics Concern  . Not on file   Social History Narrative   Originally from [place].  Moved to [facility] 2014   Lives in [place] currently with son who will be playing at [employer] in [place] with football--running back.   Daughter and her family live nearby.   Babysits grandkids     Review of Systems  Constitutional: Positive for fatigue. Negative for unexpected weight change.       More physically active since last seen.  HENT: Positive for sneezing. Negative for congestion, ear pain and hearing loss.   Eyes: Negative for discharge, redness and itching.  Respiratory: Negative for cough and shortness of breath.   Cardiovascular: Negative for chest pain, palpitations and leg swelling.  Gastrointestinal: Negative for abdominal pain, blood in stool, constipation, diarrhea and nausea.       No melena  Genitourinary: Negative for dysuria, frequency, hematuria, urgency and vaginal bleeding.  Musculoskeletal:       Knee stiffness  Skin: Negative for rash.       No skin lesions changing.  Neurological: Negative for dizziness, weakness, numbness and headaches.    Hematological: Negative for adenopathy.  Psychiatric/Behavioral: Negative for dysphoric mood and suicidal ideas. The patient is not nervous/anxious.        Objective:   Physical Exam  Constitutional: She is oriented to person, place, and time. She appears well-developed and well-nourished.  HENT:  Head: Normocephalic and atraumatic.  Right Ear: Hearing, tympanic membrane, external ear and ear canal normal.  Left Ear: Hearing, tympanic membrane, external ear and ear canal normal.  Nose: Mucosal edema present.  Mouth/Throat: Uvula is midline, oropharynx is clear and moist and mucous membranes are normal.  Mild cobbling of posterior pharynx  Eyes: Conjunctivae and EOM are normal. Pupils are equal, round, and reactive to light.  Discs sharp bilaterally.  No hemorrhages of exudates.  Neck: Normal range of motion and full passive range of motion without pain. Neck supple. Thyromegaly present.  Cardiovascular: Normal rate, regular rhythm, S1 normal and S2 normal.  Exam reveals no S3, no S4 and no friction rub.   No murmur heard. No carotid bruits.  Carotid, radial, femoral, DP and PT pulses normal and equal.   Pulmonary/Chest: Effort normal and breath sounds normal. Right breast exhibits no inverted nipple, no mass, no nipple discharge, no skin change and no tenderness. Left breast exhibits no inverted nipple, no mass, no nipple discharge, no skin change and no tenderness.  Abdominal: Soft. Bowel sounds are normal. She exhibits no mass. There is no hepatosplenomegaly. There is no tenderness. No hernia. Hernia confirmed negative in the right inguinal area and confirmed negative in the left inguinal area.  Genitourinary: Vagina normal and uterus normal. Rectal exam shows no mass, no tenderness, anal tone normal and guaiac negative stool. Cervix exhibits no motion tenderness and no discharge. Right adnexum displays no mass and no tenderness. Left adnexum displays no mass and no tenderness. No erythema  in the vagina.  Musculoskeletal: Normal range of motion.  Lymphadenopathy:       Head (right side): No submental and no submandibular adenopathy present.       Head (left side): No submental and no submandibular adenopathy present.    She has no cervical adenopathy.    She has no axillary adenopathy.       Right: No inguinal and no supraclavicular adenopathy present.       Left: No inguinal and no supraclavicular adenopathy present.  Neurological: She is alert and oriented to person, place, and time. She has normal strength and normal reflexes. No cranial nerve deficit or  sensory deficit. Coordination and gait normal.  Skin: Skin is warm and dry. No lesion and no rash noted.  Psychiatric: She has a normal mood and affect. Her speech is normal and behavior is normal. Judgment and thought content normal. Cognition and memory are normal.          Assessment & Plan:  1.  CPE with pap Tdap today. Mammogram and scholarship Wet prep shows BV, but patient states no symptoms.  No treatment.  2.  Essential Hypertension:  Amlodipine 5 mg daily.  BP check in 2 weeks.  3.  Seasonal Allergies:  Zyrtec 10 mg daily.  4.  Possible thyromegaly--neck is quite short.  Check TSH  5.  Elevated transaminase in February.  Recheck.  6.  Hyperlipidemia with lifestyle changes and weight loss:  Recheck FLP.

## 2017-01-15 LAB — HEPATIC FUNCTION PANEL
ALT: 25 IU/L (ref 0–32)
AST: 22 IU/L (ref 0–40)
Albumin: 4.4 g/dL (ref 3.5–5.5)
Alkaline Phosphatase: 70 IU/L (ref 39–117)
BILIRUBIN, DIRECT: 0.14 mg/dL (ref 0.00–0.40)
Bilirubin Total: 0.6 mg/dL (ref 0.0–1.2)
TOTAL PROTEIN: 7.6 g/dL (ref 6.0–8.5)

## 2017-01-15 LAB — LIPID PANEL W/O CHOL/HDL RATIO
Cholesterol, Total: 250 mg/dL — ABNORMAL HIGH (ref 100–199)
HDL: 61 mg/dL (ref >39)
LDL Calculated: 167 mg/dL — ABNORMAL HIGH (ref 0–99)
Triglycerides: 108 mg/dL (ref 0–149)
VLDL Cholesterol Cal: 22 mg/dL (ref 5–40)

## 2017-01-15 LAB — TSH: TSH: 1.5 u[IU]/mL (ref 0.450–4.500)

## 2017-01-16 LAB — CYTOLOGY - PAP

## 2017-03-18 ENCOUNTER — Ambulatory Visit: Payer: Self-pay | Admitting: Internal Medicine

## 2017-03-28 ENCOUNTER — Ambulatory Visit (INDEPENDENT_AMBULATORY_CARE_PROVIDER_SITE_OTHER): Payer: Self-pay | Admitting: Internal Medicine

## 2017-03-28 ENCOUNTER — Encounter: Payer: Self-pay | Admitting: Internal Medicine

## 2017-03-28 VITALS — BP 158/98 | HR 66 | Resp 17 | Ht 61.25 in | Wt 189.5 lb

## 2017-03-28 DIAGNOSIS — M25512 Pain in left shoulder: Secondary | ICD-10-CM | POA: Insufficient documentation

## 2017-03-28 DIAGNOSIS — M25511 Pain in right shoulder: Secondary | ICD-10-CM

## 2017-03-28 DIAGNOSIS — E785 Hyperlipidemia, unspecified: Secondary | ICD-10-CM

## 2017-03-28 DIAGNOSIS — E782 Mixed hyperlipidemia: Secondary | ICD-10-CM

## 2017-03-28 DIAGNOSIS — M25362 Other instability, left knee: Secondary | ICD-10-CM

## 2017-03-28 DIAGNOSIS — G8929 Other chronic pain: Secondary | ICD-10-CM | POA: Insufficient documentation

## 2017-03-28 DIAGNOSIS — I10 Essential (primary) hypertension: Secondary | ICD-10-CM

## 2017-03-28 DIAGNOSIS — M25361 Other instability, right knee: Secondary | ICD-10-CM

## 2017-03-28 HISTORY — DX: Other instability, left knee: M25.361

## 2017-03-28 HISTORY — DX: Pain in left shoulder: M25.512

## 2017-03-28 HISTORY — DX: Pain in right shoulder: M25.511

## 2017-03-28 HISTORY — DX: Hyperlipidemia, unspecified: E78.5

## 2017-03-28 MED ORDER — CYCLOBENZAPRINE HCL 10 MG PO TABS: 10.0000 mg | ORAL_TABLET | Freq: Two times a day (BID) | ORAL | 0 refills | Status: DC | PRN

## 2017-03-28 NOTE — Progress Notes (Signed)
   Subjective:    Patient ID: Sheryl Martin, female    DOB: [birth year], 51 y.o.   MRN: [id number]  HPI   1.  Essential Hypertension:  Taking Amlodipine 5 mg daily.  Does not miss.  States when she checks 130/70 range.  Checks twice daily and always in a good range.  Is having more swelling in shoulders and pain, which she thinks may be elevating her bp.  2.  Hyperlipidemia:  Has been laying off fatty foods.  Eating healthier.  Down 10 lbs from January, only 1 lb from April.   3.  Pain in shoulders with swelling.  Does a lot of heavy lifting with work and seems to exacerbate the swelling and pain.  Muscle relaxants help at bedtime.  Has been to [facility] PT clinic.  Went 2 days, but had problems with car.  Has been doing the exercises she was given, but only on weekends.    4. Left patella seems to shift and cause pain.  Does a lot of walking work.  Seems to "shift out of place"  Lock on her.  Current Meds  Medication Sig  . amLODipine (NORVASC) 5 MG tablet Take 1 tablet (5 mg total) by mouth daily.  . Aspirin-Acetaminophen-Caffeine (GOODY HEADACHE PO) Take 1 packet by mouth daily as needed (general pain).  . cetirizine (ZYRTEC) 10 MG tablet Take 1 tablet (10 mg total) by mouth daily.  . famotidine (PEPCID) 20 MG tablet 2 tabs by mouth at bedtime    Allergies  Allergen Reactions  . Naproxen   . Robaxin [Methocarbamol]          Review of Systems     Objective:   Physical Exam   NAD Tender over bilateral traps, L>>R Full ROM of shoulders Lungs:  CTA CV:  RRR without murmur or rub, radial and DP pulses normal and equal LE:  Full ROM of knees.  No tenderness of joint lines or popliteal areas.  No discomfort with compression of patellae bilaterally.  Unable to dislocate patellae, but a bit loose to lateral side, left more than right.        Assessment & Plan:  1.  Essential Hypertension:  BP check in 1 month with nurse and bring in bps from home  2.   Hyperlipidemia:  Recheck in2 months (FLP) If no improvement, consider medication.  3.  Shoulder, upper back muscle pain:  To work on exercises twice daily as given  4.  Patellar instability:  Quad strengthening exercises given--low weight, gradually increase reps.  When gets transportation, will send to PT for more treatment.

## 2017-04-02 ENCOUNTER — Ambulatory Visit
Admission: RE | Admit: 2017-04-02 | Discharge: 2017-04-02 | Disposition: A | Discharge status: Home / Self Care | Payer: No Typology Code available for payment source | Source: Ambulatory Visit | Attending: Internal Medicine | Admitting: Internal Medicine

## 2017-04-02 DIAGNOSIS — Z1239 Encounter for other screening for malignant neoplasm of breast: Secondary | ICD-10-CM

## 2017-04-03 ENCOUNTER — Other Ambulatory Visit: Payer: Self-pay | Admitting: Internal Medicine

## 2017-04-05 ENCOUNTER — Other Ambulatory Visit (HOSPITAL_COMMUNITY): Payer: Self-pay | Admitting: *Deleted

## 2017-04-05 DIAGNOSIS — R928 Other abnormal and inconclusive findings on diagnostic imaging of breast: Secondary | ICD-10-CM

## 2017-04-15 ENCOUNTER — Other Ambulatory Visit: Payer: Self-pay | Admitting: Obstetrics and Gynecology

## 2017-04-18 ENCOUNTER — Encounter (HOSPITAL_COMMUNITY): Payer: Self-pay

## 2017-04-18 ENCOUNTER — Ambulatory Visit (HOSPITAL_COMMUNITY)
Admission: RE | Admit: 2017-04-18 | Discharge: 2017-04-18 | Disposition: A | Discharge status: Home / Self Care | Payer: Self-pay | Source: Ambulatory Visit | Attending: Obstetrics and Gynecology | Admitting: Obstetrics and Gynecology

## 2017-04-18 ENCOUNTER — Ambulatory Visit
Admission: RE | Admit: 2017-04-18 | Discharge: 2017-04-18 | Disposition: A | Discharge status: Home / Self Care | Payer: No Typology Code available for payment source | Source: Ambulatory Visit | Attending: Obstetrics and Gynecology | Admitting: Obstetrics and Gynecology

## 2017-04-18 ENCOUNTER — Other Ambulatory Visit: Payer: Self-pay

## 2017-04-18 VITALS — BP 140/94 | Ht 62.0 in | Wt 189.0 lb

## 2017-04-18 DIAGNOSIS — Z1239 Encounter for other screening for malignant neoplasm of breast: Secondary | ICD-10-CM

## 2017-04-18 DIAGNOSIS — R928 Other abnormal and inconclusive findings on diagnostic imaging of breast: Secondary | ICD-10-CM

## 2017-04-18 NOTE — Progress Notes (Signed)
Patient referred to [facility] by the [place] due to recommending additional imaging of the right breast. Screening mammogram completed 04/02/2017.  Pap Smear: Pap smear not completed today. Last Pap smear was 01/14/2017 at the [facility] and normal. Per patient has no history of an abnormal Pap smear. Last Pap smear result is in EPIC.  Physical exam: Breasts Breasts symmetrical. No skin abnormalities bilateral breasts. No nipple retraction bilateral breasts. No nipple discharge bilateral breasts. No lymphadenopathy. No lumps palpated bilateral breasts. No complaints of pain or tenderness on exam. Referred patient to the [facility] for a right breast diagnostic mammogram and breast ultrasound per recommendation. Appointment scheduled for Thursday, April 18, 2017 at 1520.        Pelvic/Bimanual No Pap smear completed today since last Pap smear was 01/14/2017. Pap smear not indicated per BCCCP guidelines.   Smoking History: Patient has never smoked.  Patient Navigation: Patient education provided. Access to services provided for patient through [facility] program.   Colorectal Cancer Screening: Per patient had a colonoscopy completed in 2008. No complaints today. FIT Test given to patient to complete and return to BCCCP.

## 2017-04-18 NOTE — Patient Instructions (Signed)
Explained breast self awareness with [name]. Patient did not need a Pap smear today due to last Pap smear was 01/14/2017. Let her know BCCCP will cover Pap smears every 3 years unless has a history of abnormal Pap smears. Referred patient to the [facility] for a right breast diagnostic mammogram and breast ultrasound per recommendation. Appointment scheduled for Thursday, April 18, 2017 at 1520. Sheryl Martin verbalized understanding.  Beva Remund, Arvil Chaco, RN 1:19 PM

## 2017-04-19 ENCOUNTER — Encounter (HOSPITAL_COMMUNITY): Payer: Self-pay | Admitting: *Deleted

## 2017-04-25 LAB — FECAL OCCULT BLOOD, IMMUNOCHEMICAL: Fecal Occult Bld: NEGATIVE

## 2017-04-26 ENCOUNTER — Encounter (HOSPITAL_COMMUNITY): Payer: Self-pay | Admitting: *Deleted

## 2017-04-26 NOTE — Progress Notes (Signed)
Letter mailed to patient with negative Fit Test Results.  

## 2017-05-21 ENCOUNTER — Other Ambulatory Visit: Payer: Self-pay | Admitting: Internal Medicine

## 2017-05-27 ENCOUNTER — Other Ambulatory Visit: Payer: Self-pay | Admitting: Internal Medicine

## 2017-05-28 ENCOUNTER — Other Ambulatory Visit (INDEPENDENT_AMBULATORY_CARE_PROVIDER_SITE_OTHER): Payer: Self-pay

## 2017-05-28 DIAGNOSIS — E782 Mixed hyperlipidemia: Secondary | ICD-10-CM

## 2017-05-29 LAB — LIPID PANEL W/O CHOL/HDL RATIO
CHOLESTEROL TOTAL: 247 mg/dL — AB (ref 100–199)
HDL: 65 mg/dL (ref >39)
LDL CALC: 164 mg/dL — AB (ref 0–99)
Triglycerides: 89 mg/dL (ref 0–149)
VLDL Cholesterol Cal: 18 mg/dL (ref 5–40)

## 2017-07-08 ENCOUNTER — Ambulatory Visit (INDEPENDENT_AMBULATORY_CARE_PROVIDER_SITE_OTHER): Payer: Self-pay | Admitting: Internal Medicine

## 2017-07-08 ENCOUNTER — Telehealth: Payer: Self-pay | Admitting: Internal Medicine

## 2017-07-08 ENCOUNTER — Other Ambulatory Visit: Payer: Self-pay

## 2017-07-08 ENCOUNTER — Encounter: Payer: Self-pay | Admitting: Internal Medicine

## 2017-07-08 VITALS — BP 128/80 | HR 88 | Resp 12 | Ht 61.25 in | Wt 188.0 lb

## 2017-07-08 DIAGNOSIS — M2550 Pain in unspecified joint: Secondary | ICD-10-CM

## 2017-07-08 DIAGNOSIS — M25512 Pain in left shoulder: Secondary | ICD-10-CM

## 2017-07-08 DIAGNOSIS — R5383 Other fatigue: Secondary | ICD-10-CM

## 2017-07-08 DIAGNOSIS — I10 Essential (primary) hypertension: Secondary | ICD-10-CM

## 2017-07-08 DIAGNOSIS — M25361 Other instability, right knee: Secondary | ICD-10-CM

## 2017-07-08 DIAGNOSIS — M25362 Other instability, left knee: Secondary | ICD-10-CM

## 2017-07-08 DIAGNOSIS — Z114 Encounter for screening for human immunodeficiency virus [HIV]: Secondary | ICD-10-CM

## 2017-07-08 DIAGNOSIS — E782 Mixed hyperlipidemia: Secondary | ICD-10-CM

## 2017-07-08 DIAGNOSIS — M25511 Pain in right shoulder: Secondary | ICD-10-CM

## 2017-07-08 DIAGNOSIS — G8929 Other chronic pain: Secondary | ICD-10-CM

## 2017-07-08 DIAGNOSIS — Z1159 Encounter for screening for other viral diseases: Secondary | ICD-10-CM

## 2017-07-08 MED ORDER — CYCLOBENZAPRINE HCL 10 MG PO TABS: 10.0000 mg | ORAL_TABLET | Freq: Two times a day (BID) | ORAL | 0 refills | Status: DC | PRN

## 2017-07-08 NOTE — Patient Instructions (Signed)
Free flu vaccine at flu vaccine clinics:  September 20 8:30 to 11:30 a.m. And September 29th [facility] 1-4 p.m.  Both at [facility] next door to clinic   Shoe inserts with cushion and arch support:  If unable to find in regular pharmacy--go to [facility]. or [facility]:  [place]

## 2017-07-08 NOTE — Telephone Encounter (Signed)
Patient needs refill for cyclobenzaprine (FLEXERIL) 10 mg tablet.

## 2017-07-08 NOTE — Telephone Encounter (Signed)
Sent in error

## 2017-07-08 NOTE — Telephone Encounter (Signed)
Rx sent to pharmacy.  Please notify patient.

## 2017-07-08 NOTE — Progress Notes (Signed)
   Subjective:    Patient ID: Sheryl Martin, female    DOB: August 23, 1966, 51 y.o.   MRN: [id number]  HPI   1.  Hyperlipidemia with high HDL:   Has decreased fried, greasy foods.  Eating fruit, but cuts off the skin Eating a lot of fried fish--whities.   Has cut back on soda.  Drinking 1 mountain dew daily.  Thinks she can decrease to once weekly. Is thirsty a lot, but not urinating a lot--sugars in past ok.  Last check was in February and measured 75.  Lipid Panel     Component Value Date/Time   CHOL 247 (H) 05/28/2017 0851   TRIG 89 05/28/2017 0851   HDL 65 05/28/2017 0851   LDLCALC 164 (H) 05/28/2017 0851   2.  Problems sleeping;  Uses Tylenol PM and helps  3.  Multiple joint complaints:  Tender, but no redness or swelling.  States knees feel tight and swell, but if puts feet up, goes away quickly.  On hard floors with steel toed tennis shoes.  She does not feel there is much cushioned support inside.   Rubs hands in [facility] and helps.   Uses ibuprofen or Advil here and there for last 2-3 weeks.   Not eating salty snacks. Kneecaps no longer subluxing with quad strengthening.   Left trap remains an intermittent problem: mainly with stress.  Out of muscle relaxant. Only went to PT once--too far to drive.  States doing exercises daily  4.  Hypertension:  Taking Amlodipine regularly.  Is walking regularly.     5.  HM:  Has never had HIV or Hep C check.   6.  Fatigue:  Has noted for past month.  States was taking MV, but upset stomach, so stopped.  Also Concerned this may be reason she is fatigued.     Current Meds  Medication Sig  . amLODipine (NORVASC) 5 MG tablet Take 1 tablet (5 mg total) by mouth daily.  . Aspirin-Acetaminophen-Caffeine (GOODY HEADACHE PO) Take 1 packet by mouth daily as needed (general pain).  . cetirizine (ZYRTEC) 10 MG tablet Take 1 tablet (10 mg total) by mouth daily.  . famotidine (PEPCID) 20 MG tablet TAKE TWO TABLETS BY MOUTH AT BEDTIME     Review of Systems     Objective:   Physical Exam  Neck:  Supple, No adenopathy, no thyromegaly Lungs:  CTA CV:  RRR without murmur or rub, radial and DP pulses normal and equal Abd:  S, NT MS;  No joint effusion or synovial thickening.  No joint line tenderness of fingers or knees.  No subluxation of patella. LE: No edema        Assessment & Plan:  1.  Hyperlipidemia:  Good HDL remains, but discussed healthy diet with patient and has some work to do. Also, continues to work on physical activity  2.  Essential Hypertension:  Controlled with Amlodipine  3.  HM:  Needs Hep C and HIV check --not done at CPE  4.  Fatigue:  CBC, CMP, TSH.  5. Joint complaints:  Good shoe inserts for plantar support--suspect may be some cause of her knee discomfort.  Continue with icy hot for hands--no findings today.  6.  Left trap muscle pain:  Cyclobenzaprine and continue exercises.

## 2017-07-09 LAB — CBC WITH DIFFERENTIAL/PLATELET
BASOS: 1 %
Basophils Absolute: 0.1 10*3/uL (ref 0.0–0.2)
EOS (ABSOLUTE): 0.1 10*3/uL (ref 0.0–0.4)
EOS: 1 %
HEMATOCRIT: 37.8 % (ref 34.0–46.6)
Hemoglobin: 12.2 g/dL (ref 11.1–15.9)
Immature Grans (Abs): 0 10*3/uL (ref 0.0–0.1)
Immature Granulocytes: 0 %
Lymphocytes Absolute: 3.1 10*3/uL (ref 0.7–3.1)
Lymphs: 27 %
MCH: 28.3 pg (ref 26.6–33.0)
MCHC: 32.3 g/dL (ref 31.5–35.7)
MCV: 88 fL (ref 79–97)
MONOCYTES: 7 %
Monocytes Absolute: 0.8 10*3/uL (ref 0.1–0.9)
NEUTROS ABS: 7.5 10*3/uL — AB (ref 1.4–7.0)
Neutrophils: 64 %
Platelets: 306 10*3/uL (ref 150–379)
RBC: 4.31 x10E6/uL (ref 3.77–5.28)
RDW: 14.2 % (ref 12.3–15.4)
WBC: 11.5 10*3/uL — ABNORMAL HIGH (ref 3.4–10.8)

## 2017-07-09 LAB — HIV ANTIBODY (ROUTINE TESTING W REFLEX): HIV Screen 4th Generation wRfx: NONREACTIVE

## 2017-07-09 LAB — COMPREHENSIVE METABOLIC PANEL
ALBUMIN: 4.8 g/dL (ref 3.5–5.5)
ALK PHOS: 76 IU/L (ref 39–117)
ALT: 38 IU/L — ABNORMAL HIGH (ref 0–32)
AST: 30 IU/L (ref 0–40)
Albumin/Globulin Ratio: 1.7 (ref 1.2–2.2)
BUN / CREAT RATIO: 14 (ref 9–23)
BUN: 12 mg/dL (ref 6–24)
Bilirubin Total: 0.4 mg/dL (ref 0.0–1.2)
CO2: 26 mmol/L (ref 20–29)
CREATININE: 0.86 mg/dL (ref 0.57–1.00)
Calcium: 9.8 mg/dL (ref 8.7–10.2)
Chloride: 100 mmol/L (ref 96–106)
GFR calc Af Amer: 90 mL/min/{1.73_m2} (ref >59)
GFR, EST NON AFRICAN AMERICAN: 78 mL/min/{1.73_m2} (ref >59)
GLOBULIN, TOTAL: 2.9 g/dL (ref 1.5–4.5)
Glucose: 86 mg/dL (ref 65–99)
Potassium: 4.3 mmol/L (ref 3.5–5.2)
SODIUM: 139 mmol/L (ref 134–144)
Total Protein: 7.7 g/dL (ref 6.0–8.5)

## 2017-07-09 LAB — HEPATITIS C ANTIBODY: Hep C Virus Ab: <0.1 s/co ratio (ref 0.0–0.9)

## 2017-07-09 LAB — TSH: TSH: 0.856 u[IU]/mL (ref 0.450–4.500)

## 2017-10-03 ENCOUNTER — Other Ambulatory Visit: Payer: Self-pay

## 2017-11-08 ENCOUNTER — Ambulatory Visit (INDEPENDENT_AMBULATORY_CARE_PROVIDER_SITE_OTHER): Payer: BLUE CROSS/BLUE SHIELD | Admitting: Internal Medicine

## 2017-11-08 ENCOUNTER — Encounter: Payer: Self-pay | Admitting: Internal Medicine

## 2017-11-08 VITALS — BP 138/88 | HR 78 | Resp 12 | Ht 61.25 in | Wt 183.0 lb

## 2017-11-08 DIAGNOSIS — H524 Presbyopia: Secondary | ICD-10-CM | POA: Diagnosis not present

## 2017-11-08 DIAGNOSIS — I10 Essential (primary) hypertension: Secondary | ICD-10-CM | POA: Diagnosis not present

## 2017-11-08 MED ORDER — AMLODIPINE BESYLATE 5 MG PO TABS: 5.0000 mg | ORAL_TABLET | Freq: Every day | ORAL | 3 refills | Status: DC

## 2017-11-08 NOTE — Progress Notes (Signed)
   Subjective:    Patient ID: Sheryl Martin, female    DOB: 1966-04-09, 52 y.o.   MRN: [id number]  HPI   1.  Essential Hypertension:  Taking Amlodipine 5 mg daily.  States never misses.   Later, admits was out of Amlodipine for first 3 weeks in December as her orange card ran out.  She did get a temporary orange card for 1 month.   Was stressed trying to get to appointment today as well-hurrying. She is eating better and trying to be more physically active. Has lost some weight Has not been checking home bps very often.  Last checked in mid December, was 134/80 range.  2.  Blurred vision:  Noting she has blurriness with near vision.  Her distance vision is fine.  Current Meds  Medication Sig  . amLODipine (NORVASC) 5 MG tablet Take 1 tablet (5 mg total) by mouth daily.  . cetirizine (ZYRTEC) 10 MG tablet Take 1 tablet (10 mg total) by mouth daily.  . cyclobenzaprine (FLEXERIL) 10 MG tablet Take 1 tablet (10 mg total) by mouth 2 (two) times daily as needed for muscle spasms.  . famotidine (PEPCID) 20 MG tablet TAKE TWO TABLETS BY MOUTH AT BEDTIME  . [DISCONTINUED] amLODipine (NORVASC) 5 MG tablet Take 1 tablet (5 mg total) by mouth daily.    Allergies  Allergen Reactions  . Naproxen   . Robaxin [Methocarbamol]       Review of Systems     Objective:   Physical Exam  NAD HEENT:  PERRL, EOMI Lungs:  CTA CV:  RRR without murmur or rub.  Radial and DP pulses normal and equal       Assessment & Plan:  1.  Essential Hypertension: BP ultimately into high normal range during visit with repeat check.  Again, was out of Amlodipine for almost a month in December.   Amlodipine now on low cost list with [facility], so will change her Rx to be filled there.  Encouraged to notify clinic in the future if unable to fill med for whatever reason. To work on lifestyle changes with diet and physical activity as well.  2.  Decreased near vision:   Will call after speaks with insurance about  optometry coverage and request for referral.

## 2017-11-11 ENCOUNTER — Telehealth: Payer: Self-pay | Admitting: Internal Medicine

## 2017-11-11 NOTE — Telephone Encounter (Signed)
Patient called, insurance does not cover eye exam.  [employer].

## 2017-11-12 NOTE — Telephone Encounter (Signed)
To Dr. Mulberry for FYI 

## 2017-12-27 NOTE — Telephone Encounter (Signed)
I would encourage her to check into cost with [facility]

## 2017-12-31 NOTE — Telephone Encounter (Signed)
Left message for patient to try [facility]

## 2018-01-20 IMAGING — US ULTRASOUND RIGHT BREAST LIMITED
1 series · 5 of 5 positions shown · non-contrast
Comparison: Current screening study, 04/02/2017.

CLINICAL DATA: Screening recall for a possible right breast mass.

EXAM:
2D DIGITAL DIAGNOSTIC RIGHT MAMMOGRAM WITH CAD AND ADJUNCT TOMO
ULTRASOUND RIGHT BREAST

[Series 1: ultrasound right breast limited · 0.07mm/px · 5 of 5 slices shown]
[im 1/5]
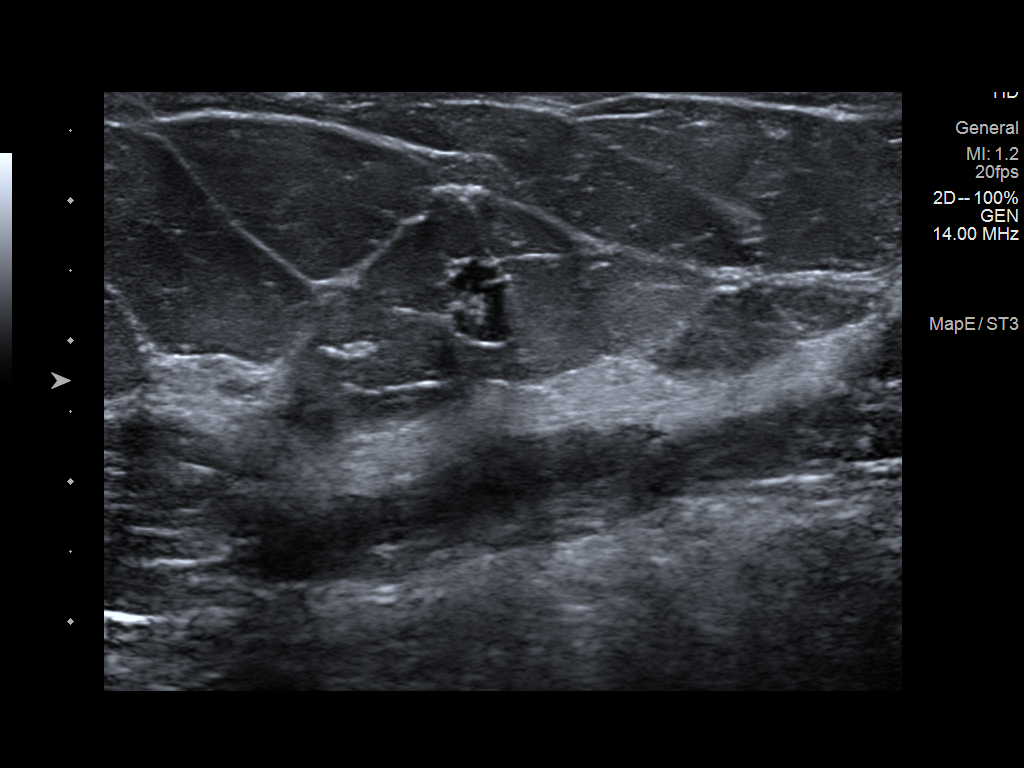
[im 2/5]
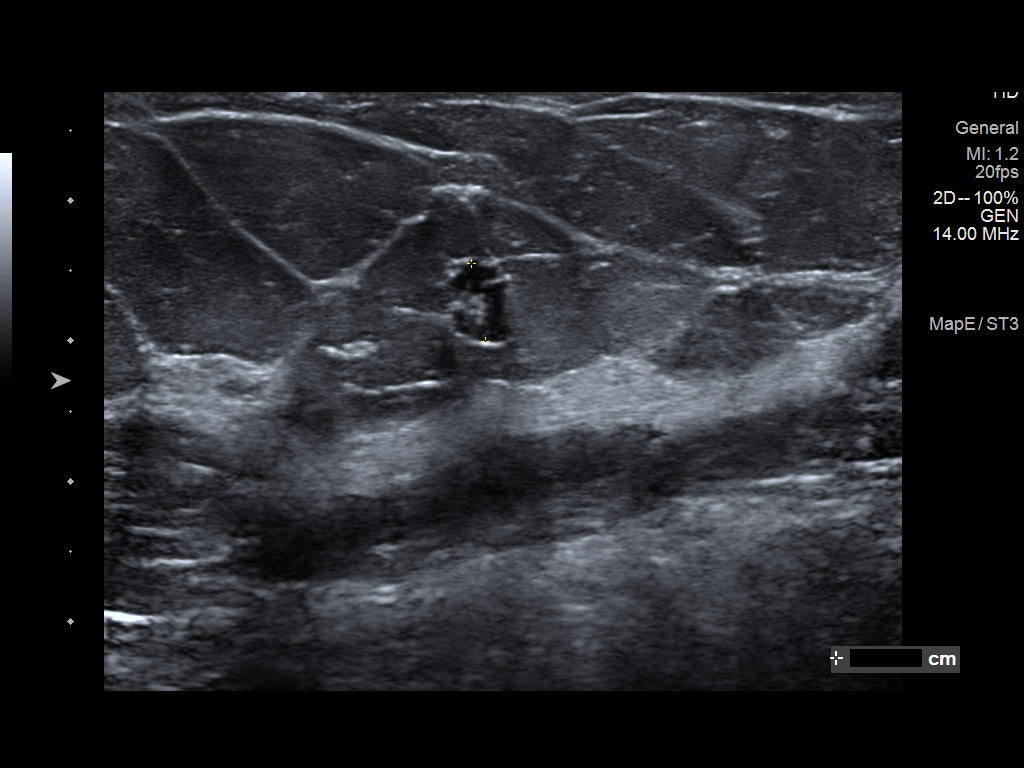
[im 3/5]
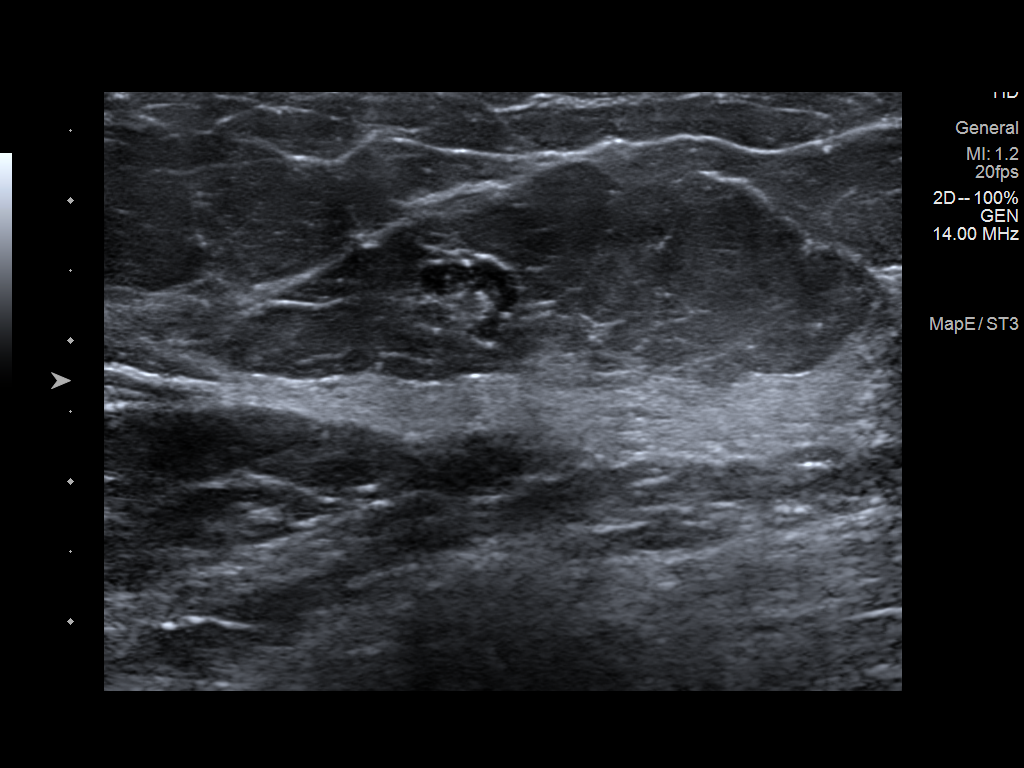
[im 4/5]
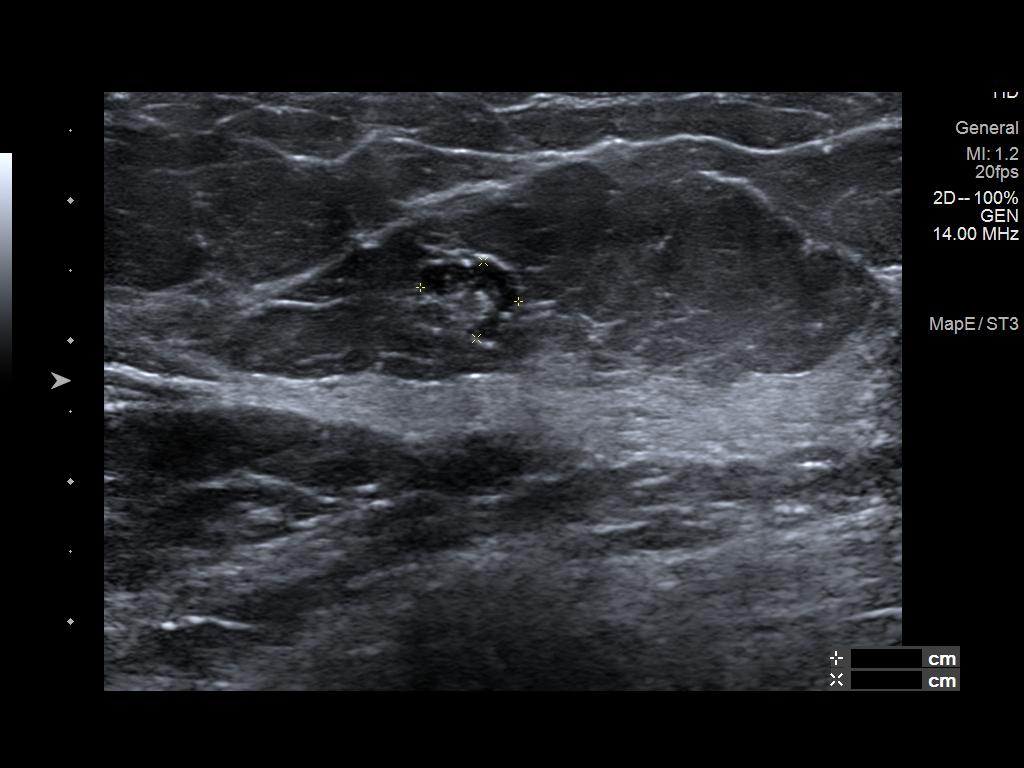
[im 5/5]
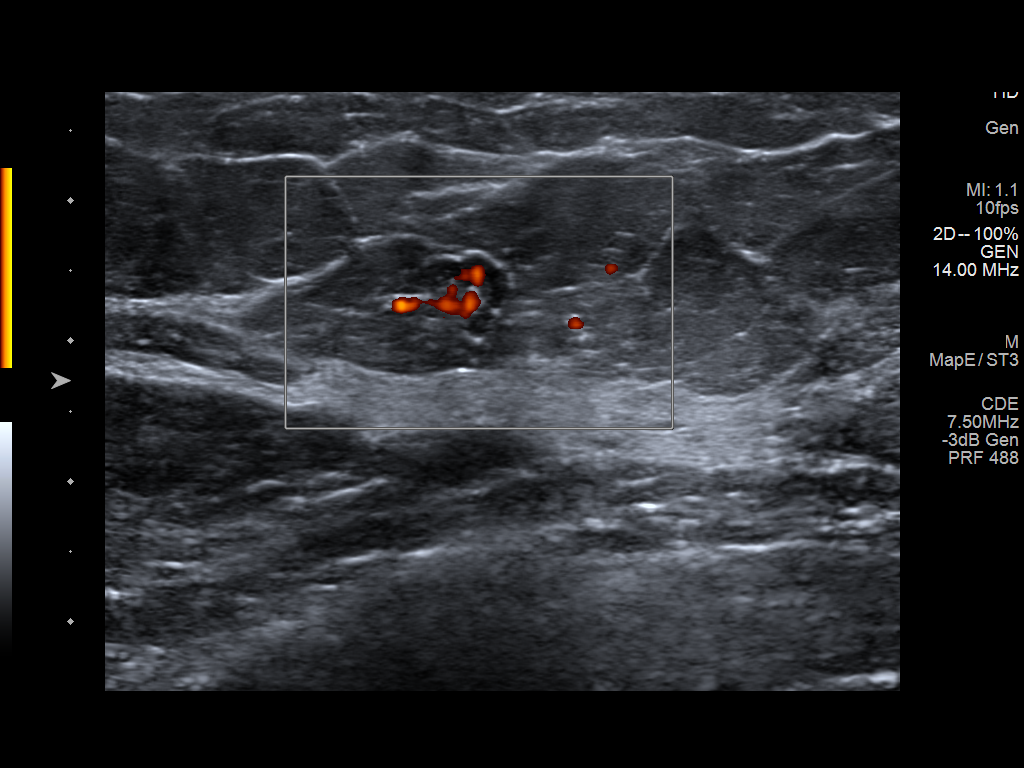

[5 of 5 positions shown; findings below may reference images not displayed]

ACR Breast Density Category b: There are scattered areas of
fibroglandular density.
FINDINGS: Small mass in the upper outer right breast persists on the
diagnostic 2D and 3D images. Margins circumscribed. Mass has
eccentric fat and typical configuration of an intramammary lymph
node.

Mammographic images were processed with CAD.

On physical exam, no mass is palpated in the upper outer right
breast.

Targeted ultrasound is performed, showing normal size, normal
morphology, intramammary lymph node at 10 o'clock, 6 cm the nipple,
measuring 7 x 6 x 6 mm. This corresponds in size, shape and location
to the mammographic finding.
IMPRESSION: 1. No evidence of malignancy.
2. Normal intramammary lymph node in the upper outer right breast.

RECOMMENDATION:
Screening mammogram in one year.(Code:NT-V-6V1)

I have discussed the findings and recommendations with the patient.
Results were also provided in writing at the conclusion of the
visit. If applicable, a reminder letter will be sent to the patient
regarding the next appointment.

BI-RADS CATEGORY  1: Negative.

## 2018-01-20 IMAGING — MG 2D DIGITAL DIAGNOSTIC UNILATERAL RIGHT MAMMOGRAM WITH CAD AND AD
2 series · 2 of 2 positions shown · non-contrast
Comparison: Current screening study, 04/02/2017.

CLINICAL DATA: Screening recall for a possible right breast mass.

EXAM:
2D DIGITAL DIAGNOSTIC RIGHT MAMMOGRAM WITH CAD AND ADJUNCT TOMO
ULTRASOUND RIGHT BREAST

[R CC synth-2D]
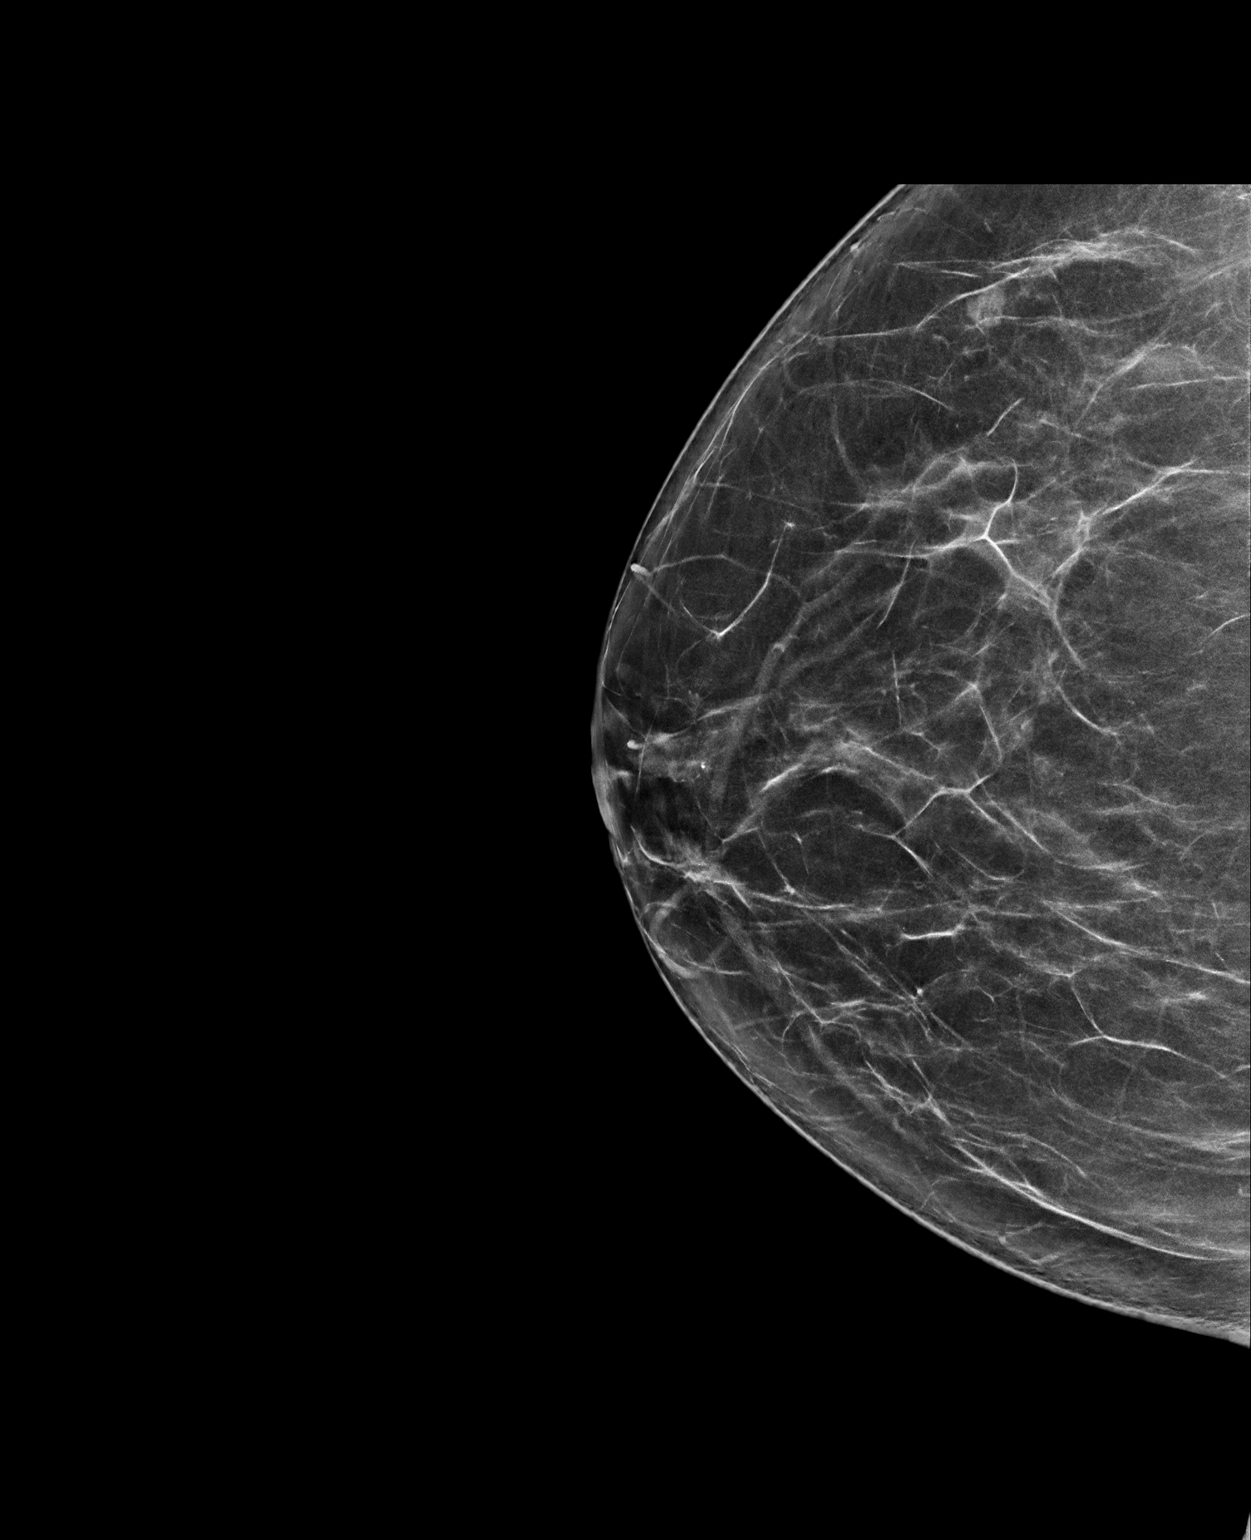

[R CC]
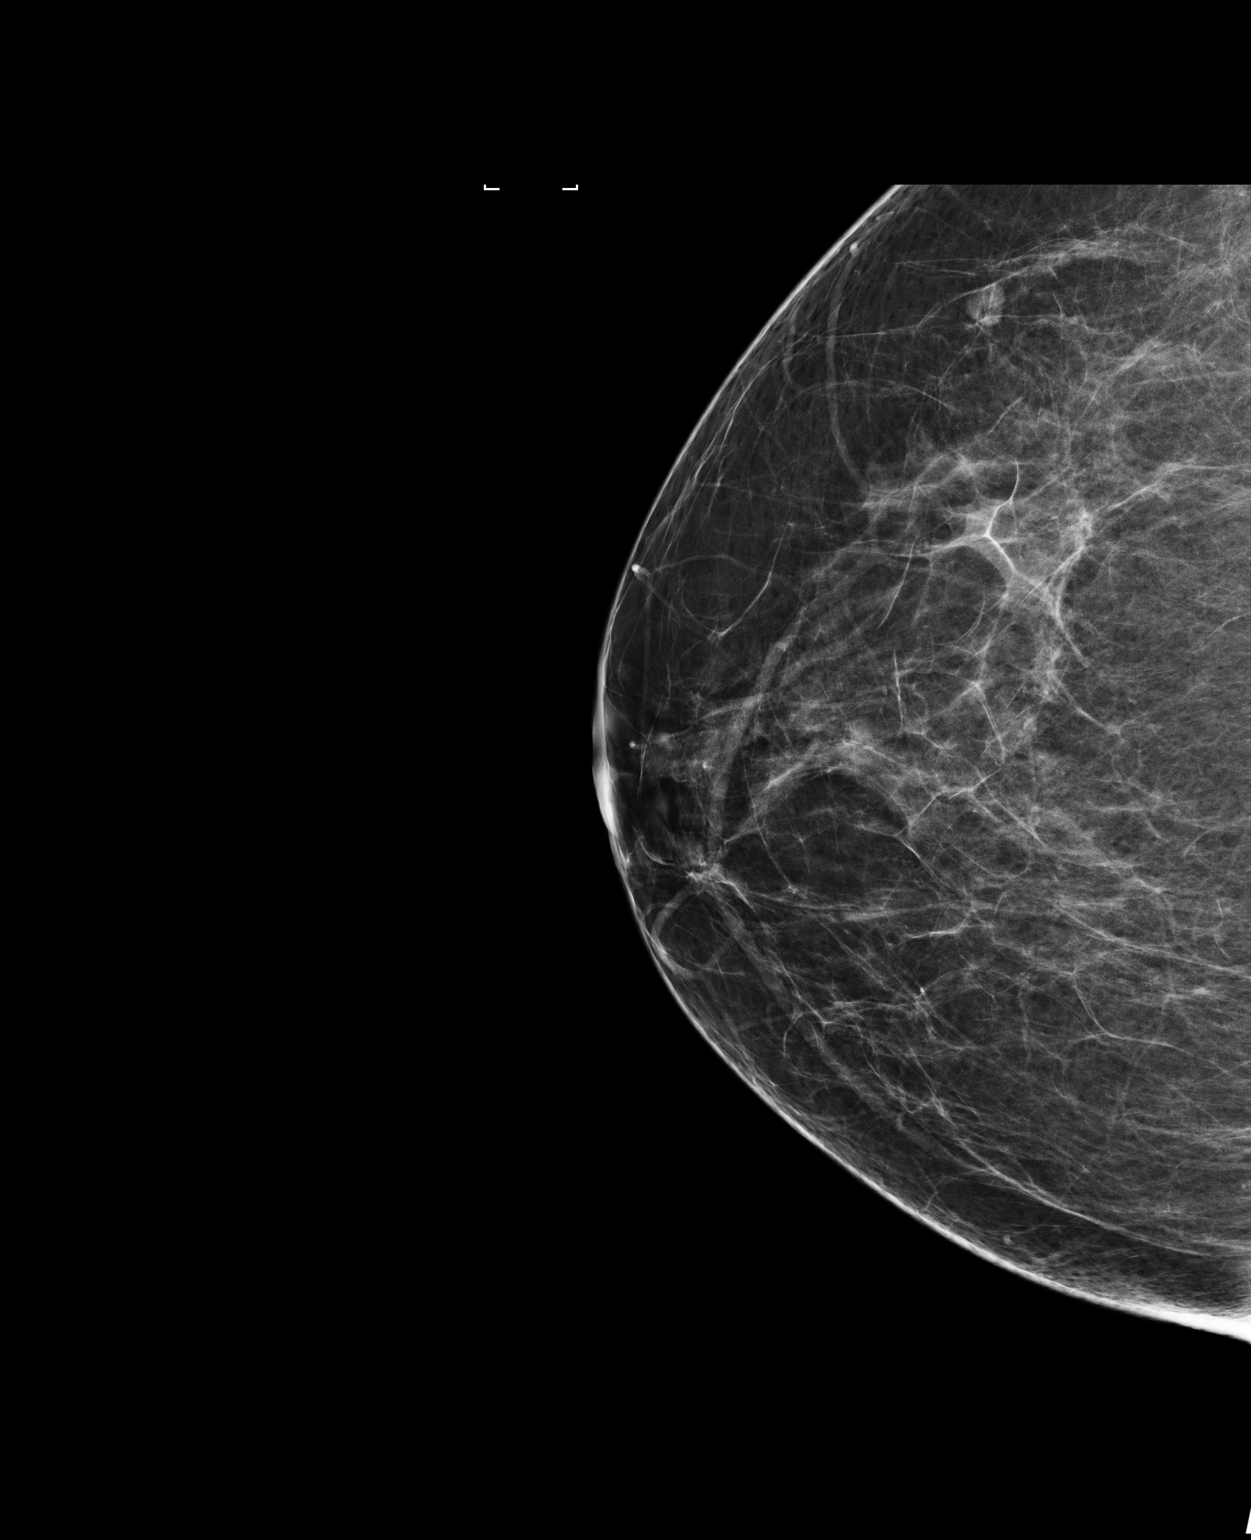

[2 of 2 positions shown; findings below may reference images not displayed]

ACR Breast Density Category b: There are scattered areas of
fibroglandular density.
FINDINGS: Small mass in the upper outer right breast persists on the
diagnostic 2D and 3D images. Margins circumscribed. Mass has
eccentric fat and typical configuration of an intramammary lymph
node.

Mammographic images were processed with CAD.

On physical exam, no mass is palpated in the upper outer right
breast.

Targeted ultrasound is performed, showing normal size, normal
morphology, intramammary lymph node at 10 o'clock, 6 cm the nipple,
measuring 7 x 6 x 6 mm. This corresponds in size, shape and location
to the mammographic finding.
IMPRESSION: 1. No evidence of malignancy.
2. Normal intramammary lymph node in the upper outer right breast.

RECOMMENDATION:
Screening mammogram in one year.(Code:NT-V-6V1)

I have discussed the findings and recommendations with the patient.
Results were also provided in writing at the conclusion of the
visit. If applicable, a reminder letter will be sent to the patient
regarding the next appointment.

BI-RADS CATEGORY  1: Negative.

## 2018-03-20 ENCOUNTER — Other Ambulatory Visit: Payer: Self-pay | Admitting: Internal Medicine

## 2018-03-20 DIAGNOSIS — Z1231 Encounter for screening mammogram for malignant neoplasm of breast: Secondary | ICD-10-CM

## 2018-04-14 ENCOUNTER — Ambulatory Visit: Payer: No Typology Code available for payment source

## 2018-05-09 ENCOUNTER — Encounter: Payer: Self-pay | Admitting: Internal Medicine

## 2018-05-09 ENCOUNTER — Ambulatory Visit: Payer: BLUE CROSS/BLUE SHIELD | Admitting: Internal Medicine

## 2018-05-09 VITALS — BP 130/90 | HR 76 | Resp 12 | Ht 61.25 in | Wt 186.0 lb

## 2018-05-09 DIAGNOSIS — Z0000 Encounter for general adult medical examination without abnormal findings: Secondary | ICD-10-CM

## 2018-05-09 DIAGNOSIS — G8929 Other chronic pain: Secondary | ICD-10-CM

## 2018-05-09 DIAGNOSIS — M549 Dorsalgia, unspecified: Secondary | ICD-10-CM

## 2018-05-09 DIAGNOSIS — M542 Cervicalgia: Secondary | ICD-10-CM

## 2018-05-09 DIAGNOSIS — M25511 Pain in right shoulder: Secondary | ICD-10-CM | POA: Diagnosis not present

## 2018-05-09 DIAGNOSIS — Z1211 Encounter for screening for malignant neoplasm of colon: Secondary | ICD-10-CM

## 2018-05-09 DIAGNOSIS — M25512 Pain in left shoulder: Secondary | ICD-10-CM

## 2018-05-09 NOTE — Progress Notes (Signed)
Subjective:    Patient ID: Sheryl Martin, female    DOB: 1966/03/05, 52 y.o.   MRN: [id number]  HPI   CPE with pap  1.  Pap:  Last pap 01/14/2017 and normal.  Always normal.  No family history of cervical cancer.  2.  Mammogram:  Last 04/02/2017--had to have new views a month later of right.  Felt to have a normal lymph node.  She had this year's scheduled, just has to cancel and plans to reschedule.  No family history of breast cancer.  3.  Osteoprevention:  Eats cheese once daily.  No significant milk intake.  Willing to try Almond milk--3 cups daily.  Dances at home with music.  Does walk regularly 3 times weekly.  4.  Guaiac Cards:  Last July.  Negative for blood  5.  Colonoscopy:  She does not recall having.  Had EGD over 10 years ago.  No family history of colon cancer.  6.  Immunizations:   Immunization History  Administered Date(s) Administered  . Tdap 01/14/2017    7.  Glucose/Cholesterol:  Fasting glucose 86 06/2017 and cholesterol/LDL high 05/2017.  Was to come in to discuss, but missed appt  Lipid Panel     Component Value Date/Time   CHOL 247 (H) 05/28/2017 0851   TRIG 89 05/28/2017 0851   HDL 65 05/28/2017 0851   LDLCALC 164 (H) 05/28/2017 0851   Current Meds  Medication Sig  . amLODipine (NORVASC) 5 MG tablet Take 1 tablet (5 mg total) by mouth daily.  . cetirizine (ZYRTEC) 10 MG tablet Take 1 tablet (10 mg total) by mouth daily.  . cyclobenzaprine (FLEXERIL) 10 MG tablet Take 1 tablet (10 mg total) by mouth 2 (two) times daily as needed for muscle spasms.  . famotidine (PEPCID) 20 MG tablet TAKE TWO TABLETS BY MOUTH AT BEDTIME  . ibuprofen (ADVIL,MOTRIN) 200 MG tablet Take 200 mg by mouth every 6 (six) hours as needed.    Allergies  Allergen Reactions  . Naproxen   . Robaxin [Methocarbamol]     Past Medical History:  Diagnosis Date  . Allergy    seasonal  . Hyperlipidemia 03/28/2017  . Hypertension 2018  . Obesity 2018  . 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 Shoulder pain, bilateral 03/28/2017  . Sinusitis     Past Surgical History:  Procedure Laterality Date  . CHOLECYSTECTOMY  2001   laparoscopic  . TUBAL LIGATION  1993    Review of Systems  Constitutional: Negative for appetite change and fatigue.  HENT: Negative for dental problem, hearing loss and sore throat.   Eyes: Positive for visual disturbance (Needs eye doctor.  wears reading glasses.).  Respiratory: Negative for shortness of breath.   Cardiovascular: Negative for chest pain, palpitations and leg swelling.  Gastrointestinal: Negative for abdominal pain, blood in stool (No melena), constipation and diarrhea.  Genitourinary: Negative for dysuria, frequency and vaginal discharge.  Musculoskeletal:       Neck, upper back and shoulder pain especially with stress   Neurological: Positive for headaches (with neck pain).  Psychiatric/Behavioral: Negative for dysphoric mood. The patient is not nervous/anxious (stuff gets on nerves a lot.  Unable to get a clear idea of how signficantly affecting life.).        Objective:   Physical Exam  Constitutional: She is oriented to person, place, and time. She appears well-developed and well-nourished.  HENT:  Head: Normocephalic and atraumatic.  Left Ear: External ear normal.  Nose:  Nose normal.  Mouth/Throat: Oropharynx is clear and moist.  Eyes: Pupils are equal, round, and reactive to light. Conjunctivae and EOM are normal.  Neck: Normal range of motion. Neck supple. No thyromegaly present.  Cardiovascular: Normal rate and regular rhythm. Exam reveals no friction rub.  No murmur heard. Pulmonary/Chest: Effort normal and breath sounds normal.  Abdominal: Soft. Bowel sounds are normal. She exhibits no mass. There is no tenderness. No hernia.  Musculoskeletal: Normal range of motion.  Neurological: She is alert and oriented to person, place, and time. No cranial nerve deficit. She exhibits normal muscle tone.  Coordination normal.  Skin: Skin is warm. Capillary refill takes less than 2 seconds.  Psychiatric: She has a normal mood and affect. Her behavior is normal. Judgment and thought content normal.          Assessment & Plan:  1.  CPE without pap Patient in process of rescheduling mammogram To call for influenza vaccine clinic times Referral for screening colonoscopy  2.  Headaches, neck and upper back pain:  Referral to PT to improve discomfort, ROM, and strength  3.  Essential Hypertension:  A bit borderline today.  Repeat BP in 1-2 weeks with fasting labs.  CMP with fasting labs in 1-2 weeks.  4.  Hyperlipidemia:  FLP in 1-2 weeks.

## 2018-05-22 ENCOUNTER — Other Ambulatory Visit (INDEPENDENT_AMBULATORY_CARE_PROVIDER_SITE_OTHER): Payer: BLUE CROSS/BLUE SHIELD

## 2018-05-22 DIAGNOSIS — Z1211 Encounter for screening for malignant neoplasm of colon: Secondary | ICD-10-CM | POA: Diagnosis not present

## 2018-05-22 LAB — POC HEMOCCULT BLD/STL (HOME/3-CARD/SCREEN)
Card #3 Fecal Occult Blood, POC: NEGATIVE
FECAL OCCULT BLD: NEGATIVE
Fecal Occult Blood, POC: NEGATIVE

## 2018-05-26 ENCOUNTER — Other Ambulatory Visit (INDEPENDENT_AMBULATORY_CARE_PROVIDER_SITE_OTHER): Payer: BLUE CROSS/BLUE SHIELD

## 2018-05-26 VITALS — BP 130/80 | HR 72

## 2018-05-26 DIAGNOSIS — I10 Essential (primary) hypertension: Secondary | ICD-10-CM

## 2018-05-26 DIAGNOSIS — E782 Mixed hyperlipidemia: Secondary | ICD-10-CM

## 2018-05-26 DIAGNOSIS — Z79899 Other long term (current) drug therapy: Secondary | ICD-10-CM

## 2018-05-26 NOTE — Addendum Note (Signed)
Addended by: Serafina Mitchell on: 05/26/2018 11:35 AM   Modules accepted: Level of Service

## 2018-05-26 NOTE — Progress Notes (Signed)
Patient BP in normal range. Informed to continue current therapy.patient verbalized understanding

## 2018-05-27 LAB — COMPREHENSIVE METABOLIC PANEL
A/G RATIO: 1.6 (ref 1.2–2.2)
ALT: 33 IU/L — ABNORMAL HIGH (ref 0–32)
AST: 25 IU/L (ref 0–40)
Albumin: 4.5 g/dL (ref 3.5–5.5)
Alkaline Phosphatase: 73 IU/L (ref 39–117)
BILIRUBIN TOTAL: 0.5 mg/dL (ref 0.0–1.2)
BUN/Creatinine Ratio: 10 (ref 9–23)
BUN: 7 mg/dL (ref 6–24)
CALCIUM: 9.6 mg/dL (ref 8.7–10.2)
CO2: 26 mmol/L (ref 20–29)
Chloride: 103 mmol/L (ref 96–106)
Creatinine, Ser: 0.73 mg/dL (ref 0.57–1.00)
GFR, EST AFRICAN AMERICAN: 109 mL/min/{1.73_m2} (ref >59)
GFR, EST NON AFRICAN AMERICAN: 94 mL/min/{1.73_m2} (ref >59)
GLOBULIN, TOTAL: 2.8 g/dL (ref 1.5–4.5)
Glucose: 84 mg/dL (ref 65–99)
POTASSIUM: 4.2 mmol/L (ref 3.5–5.2)
SODIUM: 140 mmol/L (ref 134–144)
TOTAL PROTEIN: 7.3 g/dL (ref 6.0–8.5)

## 2018-05-27 LAB — LIPID PANEL W/O CHOL/HDL RATIO
CHOLESTEROL TOTAL: 223 mg/dL — AB (ref 100–199)
HDL: 65 mg/dL (ref >39)
LDL Calculated: 141 mg/dL — ABNORMAL HIGH (ref 0–99)
TRIGLYCERIDES: 86 mg/dL (ref 0–149)
VLDL Cholesterol Cal: 17 mg/dL (ref 5–40)

## 2018-05-29 ENCOUNTER — Ambulatory Visit: Payer: No Typology Code available for payment source

## 2018-06-03 ENCOUNTER — Ambulatory Visit: Payer: BLUE CROSS/BLUE SHIELD | Admitting: Physical Therapy

## 2018-06-25 ENCOUNTER — Other Ambulatory Visit: Payer: Self-pay | Admitting: Internal Medicine

## 2018-07-17 ENCOUNTER — Ambulatory Visit
Admission: RE | Admit: 2018-07-17 | Discharge: 2018-07-17 | Disposition: A | Discharge status: Home / Self Care | Payer: BLUE CROSS/BLUE SHIELD | Source: Ambulatory Visit | Attending: Internal Medicine | Admitting: Internal Medicine

## 2018-07-17 DIAGNOSIS — Z1231 Encounter for screening mammogram for malignant neoplasm of breast: Secondary | ICD-10-CM

## 2018-08-11 ENCOUNTER — Other Ambulatory Visit (INDEPENDENT_AMBULATORY_CARE_PROVIDER_SITE_OTHER): Payer: BLUE CROSS/BLUE SHIELD

## 2018-08-11 DIAGNOSIS — R748 Abnormal levels of other serum enzymes: Secondary | ICD-10-CM | POA: Diagnosis not present

## 2018-08-12 LAB — HEPATIC FUNCTION PANEL
ALBUMIN: 4.4 g/dL (ref 3.5–5.5)
ALK PHOS: 73 IU/L (ref 39–117)
ALT: 25 IU/L (ref 0–32)
AST: 21 IU/L (ref 0–40)
BILIRUBIN, DIRECT: 0.1 mg/dL (ref 0.00–0.40)
Bilirubin Total: 0.5 mg/dL (ref 0.0–1.2)
TOTAL PROTEIN: 7.3 g/dL (ref 6.0–8.5)

## 2018-10-14 ENCOUNTER — Other Ambulatory Visit: Payer: Self-pay | Admitting: Internal Medicine

## 2018-11-10 ENCOUNTER — Ambulatory Visit: Payer: BLUE CROSS/BLUE SHIELD | Admitting: Internal Medicine

## 2018-11-10 ENCOUNTER — Encounter: Payer: Self-pay | Admitting: Internal Medicine

## 2018-11-10 VITALS — BP 140/90 | HR 70 | Resp 12 | Ht 61.25 in | Wt 185.0 lb

## 2018-11-10 DIAGNOSIS — M542 Cervicalgia: Secondary | ICD-10-CM

## 2018-11-10 DIAGNOSIS — I10 Essential (primary) hypertension: Secondary | ICD-10-CM | POA: Diagnosis not present

## 2018-11-10 DIAGNOSIS — M25511 Pain in right shoulder: Secondary | ICD-10-CM | POA: Diagnosis not present

## 2018-11-10 DIAGNOSIS — Z9109 Other allergy status, other than to drugs and biological substances: Secondary | ICD-10-CM | POA: Diagnosis not present

## 2018-11-10 DIAGNOSIS — G8929 Other chronic pain: Secondary | ICD-10-CM

## 2018-11-10 DIAGNOSIS — M25512 Pain in left shoulder: Secondary | ICD-10-CM

## 2018-11-10 MED ORDER — OLOPATADINE HCL 0.1 % OP SOLN: OPHTHALMIC | 12 refills | Status: DC

## 2018-11-10 MED ORDER — AMLODIPINE BESYLATE 10 MG PO TABS: ORAL_TABLET | ORAL | 3 refills | Status: DC

## 2018-11-10 MED ORDER — FLUTICASONE PROPIONATE 50 MCG/ACT NA SUSP: 2.0000 | Freq: Every day | NASAL | 11 refills | Status: DC

## 2018-11-10 NOTE — Progress Notes (Signed)
Subjective:    Patient ID: Sheryl Martin, female   DOB: Mar 14, 1966, 53 y.o.   MRN: [id number]   HPI   1.  Hypertension:  BP borderline again.  She states she has had a dry cough since Christmas following a cough and her shoulders hurt.  Feels that affects her bp. Not missing the Amlodipine, but only taking 5 mg daily.    2.  Headaches/neck/shoulder pain:  Did not go to PT as her grandchildren got sick and she needed to care for them instead.  She feels when she is frustrated with people on her job and her children, her shoulders start to hurt more.   She is not interested in getting counseling to learn techniques to calm or deal with drama around her.  She would be willing to get to PT.   She would like to get hold of BCBS first.  Has not been able to contact anyone via phone, but has not tried on line.  3.  Cough:  Had a cold a Christmas and still with dry cough left over.  No posterior pharyngeal drainage. She is having itchy, watery eyes.  Using Visine eye drops.  Also with itchy ears and sometimes throat is scratchy.   Not a lot of sneezing.  Nose does itch, but not sneezing much. She is taking Cetirizine 10 mg daily.   Has tried lemon and tea. Notes a smell of mildew in her apartment.  Has spoken with landlord to no avail. She wipes down her walls with bleach and water.   She states it looks like her walls "sweat" She does not note any discoloration of ceiling or walls.  Windows look fine as well and don't appear to leak. No roaches, though her neighbor has had roaches. Has wall to wall carpeting.   Problems with burners shocking her when she uses.    Current Meds  Medication Sig  . amLODipine (NORVASC) 5 MG tablet TAKE 1 TABLET BY MOUTH ONCE DAILY  . cetirizine (ZYRTEC) 10 MG tablet Take 1 tablet (10 mg total) by mouth daily.  . cyclobenzaprine (FLEXERIL) 10 MG tablet Take 1 tablet (10 mg total) by mouth 2 (two) times daily as needed for muscle spasms.  . famotidine  (PEPCID) 20 MG tablet TAKE 2 TABLETS BY MOUTH AT BEDTIME  . ibuprofen (ADVIL,MOTRIN) 200 MG tablet Take 200 mg by mouth every 6 (six) hours as needed.   Allergies  Allergen Reactions  . Naproxen   . Robaxin [Methocarbamol]      Review of Systems    Objective:   BP 140/90 (BP Location: Left Arm, Patient Position: Sitting, Cuff Size: Normal)   Pulse 70   Resp 12   Ht 5' 1.25" (1.556 m)   Wt 185 lb (83.9 kg)   LMP 12/14/2006 (Approximate)   BMI 34.67 kg/m   Physical Exam   NAD HEENT:  PERRL, EOMI, Conjunctivae without injection.  TMs pearly gray with dry cerumen in canals bilaterally, Nasal mucosa boggy with clear discharge.  Mild cobbling of posterior pharynx. Neck:  Supple, No adenopathy Chest:  CTA CV:  RRR without murmur or rub.  Radial and DP pulses normal and equal LE:  No edema MS: poor core posture leading to hyperextension of neck and slumped shoulders. Tender across traps bilaterally, these muscles tight as well.    Assessment & Plan   1.  Hypertension:  Increase Amlodipine to 10 mg daily as often borderline.   Repeat BP check in 2  weeks.  2.  Allergies:  Recognizing this may be due to mold exposure in her home.   Discussed putting her request to her landlord in writing and dating her letter.  Copy to be kept for her records as well. Referral through [facility] to [employer] for [employer] inspection and recommendations. Add Fluticasone Add Patanol eye drops May purchase Fluticasone and Zaditor OTC if not covered by her [employer] policy.  3.  Upper back and neck pain:  To work on posture.  She would like to check about PT coverage with her [facility] BCBS policy before re referral to PT.  She will let me know.  Continuing to use Cyclobenzaprine and Ibuprofen as needed for the discomfort.

## 2018-12-29 ENCOUNTER — Ambulatory Visit: Payer: BLUE CROSS/BLUE SHIELD | Admitting: Internal Medicine

## 2019-01-12 ENCOUNTER — Other Ambulatory Visit: Payer: Self-pay | Admitting: Internal Medicine

## 2019-03-19 ENCOUNTER — Other Ambulatory Visit: Payer: Self-pay | Admitting: Internal Medicine

## 2019-04-20 ENCOUNTER — Encounter: Payer: BLUE CROSS/BLUE SHIELD | Admitting: Internal Medicine

## 2019-05-20 ENCOUNTER — Encounter: Payer: BLUE CROSS/BLUE SHIELD | Admitting: Internal Medicine

## 2019-06-15 ENCOUNTER — Encounter: Payer: Self-pay | Admitting: Internal Medicine

## 2019-06-15 ENCOUNTER — Other Ambulatory Visit: Payer: Self-pay

## 2019-06-15 ENCOUNTER — Other Ambulatory Visit: Payer: Self-pay | Admitting: Internal Medicine

## 2019-06-15 ENCOUNTER — Ambulatory Visit (INDEPENDENT_AMBULATORY_CARE_PROVIDER_SITE_OTHER): Payer: Self-pay | Admitting: Internal Medicine

## 2019-06-15 VITALS — BP 138/80 | HR 68 | Resp 12 | Ht 61.25 in | Wt 175.0 lb

## 2019-06-15 DIAGNOSIS — Z9109 Other allergy status, other than to drugs and biological substances: Secondary | ICD-10-CM

## 2019-06-15 DIAGNOSIS — M25512 Pain in left shoulder: Secondary | ICD-10-CM

## 2019-06-15 DIAGNOSIS — Z780 Asymptomatic menopausal state: Secondary | ICD-10-CM

## 2019-06-15 DIAGNOSIS — M25511 Pain in right shoulder: Secondary | ICD-10-CM

## 2019-06-15 DIAGNOSIS — Z0000 Encounter for general adult medical examination without abnormal findings: Secondary | ICD-10-CM

## 2019-06-15 DIAGNOSIS — J302 Other seasonal allergic rhinitis: Secondary | ICD-10-CM

## 2019-06-15 DIAGNOSIS — G8929 Other chronic pain: Secondary | ICD-10-CM

## 2019-06-15 DIAGNOSIS — I10 Essential (primary) hypertension: Secondary | ICD-10-CM

## 2019-06-15 DIAGNOSIS — Z1231 Encounter for screening mammogram for malignant neoplasm of breast: Secondary | ICD-10-CM

## 2019-06-15 DIAGNOSIS — E782 Mixed hyperlipidemia: Secondary | ICD-10-CM

## 2019-06-15 MED ORDER — OLOPATADINE HCL 0.2 % OP SOLN: OPHTHALMIC | 11 refills | Status: DC

## 2019-06-15 NOTE — Progress Notes (Signed)
Subjective:    Patient ID: Sheryl Martin, female   DOB: [birth year], 53 y.o.   MRN: [id number]   HPI  CPE without pap  1.  Pap:  Last in 2018 and normal.    2.  Mammogram:  Last 07/2018 and normal.  No family history of breast cancer.  Just received letter to schedule for this year's.    3.  Osteoprevention:  Almond milk--only 1 cup daily.  Eats cheese 3 times daily.  Walks to work and back--about 30 minutes daily.  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 She has never had a bone density checked. Postmenopausal--no period since 2008  4.  Guaiac Cards:  Performed 2019 and negative.  5.  Colonoscopy:  Referred last year to Dr. Michail Sermon, GI in July.  Not clear why did not get set up.  6.  Immunizations:   Immunization History  Administered Date(s) Administered  . Tdap 01/14/2017     7.  Glucose/Cholesterol: No history of elevated glucose.  Lipids elevated in past.   Nonfasting this morning.  Lipid Panel     Component Value Date/Time   CHOL 223 (H) 05/26/2018 0949   TRIG 86 05/26/2018 0949   HDL 65 05/26/2018 0949   LDLCALC 141 (H) 05/26/2018 0949     Current Meds  Medication Sig  . amLODipine (NORVASC) 10 MG tablet 1 tab by mouth daily.  . cetirizine (ZYRTEC) 10 MG tablet Take 1 tablet by mouth once daily  . cyclobenzaprine (FLEXERIL) 10 MG tablet TAKE 1 TABLET BY MOUTH TWICE DAILY AS NEEDED FOR MUSCLE SPASM  . famotidine (PEPCID) 20 MG tablet TAKE 2 TABLETS BY MOUTH AT BEDTIME  . fluticasone (FLONASE) 50 MCG/ACT nasal spray Place 2 sprays into both nostrils daily.  Marland Kitchen olopatadine (PATANOL) 0.1 % ophthalmic solution 1 drop both eyes twice daily as needed for allergies   Allergies  Allergen Reactions  . Naproxen   . Robaxin [Methocarbamol]    Past Medical History:  Diagnosis Date  . Hyperlipidemia 03/28/2017  . Hypertension 2018  . Obesity 2018  . 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 Seasonal allergies   . Shoulder pain, bilateral 03/28/2017  .  Sinusitis    Past Surgical History:  Procedure Laterality Date  . CHOLECYSTECTOMY  2001   laparoscopic  . TUBAL LIGATION  1993   Family History  Problem Relation Age of Onset  . Hypertension Mother   . Stroke Mother   . Cancer Father        lung cancer--smoker  . Hypertension Sister   . Hypertension Brother   . Diabetes Brother   . Diabetes Sister   . Hypertension Sister        peripheral neuropathy  . Peripheral vascular disease Sister   . Hypertension Sister   . Seizures Sister   . Hypertension Sister   . Hypertension Sister   . Hypertension Sister   . Hypertension Sister   . Diabetes Sister   . Hypertension Brother   . Alcohol abuse Brother   . Cirrhosis Brother   . Hypertension Brother   . Alcohol abuse Brother   . Seizures Brother   . Stroke Brother   . Stroke Brother   . Hypertension Brother   . Hypertension Brother    Social History   Socioeconomic History  . Marital status: Single    Spouse name: Not on file  . Number of children: 2  . Years of education: 59  . Highest education  level: Not on file  Occupational History  . Occupation: Pensions consultant at [employer]  Social Needs  . Financial resource strain: Not hard at all  . Food insecurity    Worry: Never true    Inability: Never true  . Transportation needs    Medical: No    Non-medical: No  Tobacco Use  . Smoking status: Never Smoker  . Smokeless tobacco: Never Used  Substance and Sexual Activity  . Alcohol use: Yes    Comment: rare  . Drug use: No  . Sexual activity: Yes    Birth control/protection: Post-menopausal  Lifestyle  . Physical activity    Days per week: 7 days    Minutes per session: 80 min  . Stress: Rather much  Relationships  . Social connections    Talks on phone: More than three times a week    Gets together: More than three times a week    Attends religious service: More than 4 times per year    Active member of club or organization: No    Attends meetings of  clubs or organizations: Never    Relationship status: Living with partner  . Intimate partner violence    Fear of current or ex partner: Not on file    Emotionally abused: Not on file    Physically abused: Not on file    Forced sexual activity: Not on file  Other Topics Concern  . Not on file  Social History Narrative   Originally from [place].   Moved to [place] 2014   Son is playing at [employer] in [place] with football--running back.   Son lives with her part time.   Daughter and her family live nearby.     Review of Systems  Constitutional: Negative for activity change, fatigue and unexpected weight change.  HENT: Positive for dental problem (Needs to get to a dentist.  Not sure if her [employer] policy covers dental.), postnasal drip and rhinorrhea. Negative for hearing loss, sneezing and sore throat.   Eyes: Positive for itching and visual disturbance (vision blurry.).  Respiratory: Negative for cough and shortness of breath.   Cardiovascular: Negative for chest pain, palpitations and leg swelling.  Gastrointestinal: Negative for abdominal pain, blood in stool (No melena), constipation and diarrhea.  Genitourinary: Negative for dysuria and vaginal discharge.  Musculoskeletal: Positive for arthralgias (shoulders hurt bilaterally--chronic.  Has been to PT in [facility] and did not help.  ) and neck pain (Associated with shoulder pain.).  Skin: Negative for rash.  Neurological: Negative for weakness and numbness.  Psychiatric/Behavioral: Negative for dysphoric mood. The patient is not nervous/anxious.       Objective:   BP 138/80 (BP Location: Left Arm, Patient Position: Sitting, Cuff Size: Normal)   Pulse 68   Resp 12   Ht 5' 1.25" (1.556 m)   Wt 175 lb (79.4 kg)   LMP 12/14/2006 (Approximate)   BMI 32.80 kg/m   Physical Exam  Constitutional: She is oriented to person, place, and time. She appears well-developed and well-nourished.  HENT:  Head: Normocephalic and  atraumatic.  Right Ear: Hearing, tympanic membrane, external ear and ear canal normal.  Left Ear: Hearing, tympanic membrane, external ear and ear canal normal.  Nose: Nose normal.  Mouth/Throat: Uvula is midline, oropharynx is clear and moist and mucous membranes are normal.  Eyes: Pupils are equal, round, and reactive to light. Conjunctivae and EOM are normal.  Discs sharp bilaterally  Neck: Normal range of motion and full  passive range of motion without pain. Neck supple. No thyromegaly present.  Cardiovascular: Normal rate, regular rhythm, S1 normal and S2 normal. Exam reveals no S3, no S4 and no friction rub.  No murmur heard. No carotid bruits.  Carotid, radial, femoral, DP and PT pulses normal and equal.   Pulmonary/Chest: Effort normal and breath sounds normal. Right breast exhibits no inverted nipple, no mass, no nipple discharge, no skin change and no tenderness. Left breast exhibits no inverted nipple, no mass, no nipple discharge, no skin change and no tenderness.  Abdominal: Soft. Bowel sounds are normal. She exhibits no mass. There is no hepatosplenomegaly. There is no abdominal tenderness. No hernia.  Genitourinary:    Genitourinary Comments: Normal external female genitalia.   No uterine or adnexal mass or tenderness. Rectal:  No mass, heme negative stool.   Musculoskeletal: Normal range of motion.     Comments: Poor posture with sitting with neck hyperextended and forward slump of shoulders.  Tender across traps bilaterally.  Full ROM of shoulders  Lymphadenopathy:       Head (right side): No submental and no submandibular adenopathy present.       Head (left side): No submental and no submandibular adenopathy present.    She has no cervical adenopathy.    She has no axillary adenopathy.       Right: No inguinal and no supraclavicular adenopathy present.       Left: No inguinal and no supraclavicular adenopathy present.  Neurological: She is alert and oriented to person,  place, and time. She has normal strength and normal reflexes. No cranial nerve deficit or sensory deficit. Coordination and gait normal.  Skin: Skin is warm. No rash noted.  Psychiatric: She has a normal mood and affect. Her speech is normal and behavior is normal. Judgment and thought content normal. Cognition and memory are normal.     Assessment & Plan   1.  CPE without pap Guaiac cards x 3, to return in 2 weeks. Influenza vaccine Sept 17 clinic. Refer again to Dr. Michail Sermon, GI for colonscopy--to call if does not hear anything. She will call to set up mammogram DEXA, but continue calcium and Vitamin D rich foods and walking.  2.  Hypertension:  Controlled  3.  Hyperlipidemia:  Return next available for blood work.   4.  Dental:  List of dentists given she may be able to work out payment plan if not covered by [facility]  5.  Bilateral chronic neck and shoulder pain:  Ortho referral.  6.  Allergic conjunctivitis:  Pataday eye drops

## 2019-06-26 ENCOUNTER — Other Ambulatory Visit: Payer: Self-pay | Admitting: Internal Medicine

## 2019-06-29 ENCOUNTER — Other Ambulatory Visit: Payer: Self-pay

## 2019-06-29 ENCOUNTER — Other Ambulatory Visit (INDEPENDENT_AMBULATORY_CARE_PROVIDER_SITE_OTHER): Payer: Self-pay

## 2019-06-29 DIAGNOSIS — Z1211 Encounter for screening for malignant neoplasm of colon: Secondary | ICD-10-CM | POA: Diagnosis not present

## 2019-06-29 LAB — POC HEMOCCULT BLD/STL (HOME/3-CARD/SCREEN)
Card #2 Fecal Occult Blod, POC: NEGATIVE
Card #3 Fecal Occult Blood, POC: NEGATIVE
Fecal Occult Blood, POC: NEGATIVE

## 2019-07-01 ENCOUNTER — Ambulatory Visit: Payer: Self-pay

## 2019-07-01 ENCOUNTER — Ambulatory Visit (INDEPENDENT_AMBULATORY_CARE_PROVIDER_SITE_OTHER): Payer: Self-pay | Admitting: Surgery

## 2019-07-01 ENCOUNTER — Encounter: Payer: Self-pay | Admitting: Surgery

## 2019-07-01 VITALS — Ht 65.0 in | Wt 175.0 lb

## 2019-07-01 DIAGNOSIS — M25512 Pain in left shoulder: Secondary | ICD-10-CM

## 2019-07-01 DIAGNOSIS — M25511 Pain in right shoulder: Secondary | ICD-10-CM

## 2019-07-01 DIAGNOSIS — M5030 Other cervical disc degeneration, unspecified cervical region: Secondary | ICD-10-CM

## 2019-07-01 DIAGNOSIS — M2550 Pain in unspecified joint: Secondary | ICD-10-CM

## 2019-07-01 DIAGNOSIS — M4722 Other spondylosis with radiculopathy, cervical region: Secondary | ICD-10-CM

## 2019-07-01 DIAGNOSIS — M542 Cervicalgia: Secondary | ICD-10-CM

## 2019-07-01 MED ORDER — METHYLPREDNISOLONE 4 MG PO TABS: ORAL_TABLET | ORAL | 0 refills | Status: DC

## 2019-07-01 NOTE — Progress Notes (Signed)
Office Visit Note   Patient: Sheryl Martin           Date of Birth: Jul 23, 1966           MRN: [id number] Visit Date: 07/01/2019              Requested by: Mack Hook, MD [place],  [place] [id number] PCP: Mack Hook, MD   Assessment & Plan: Visit Diagnoses:  1. Bilateral shoulder pain, unspecified chronicity   2. Cervicalgia   3. Other spondylosis with radiculopathy, cervical region   4. Polyarthralgia   5. Other cervical disc degeneration, unspecified cervical region     Plan: With patient's ongoing worsening neck pain and radiculopathy that is failed conservative treatment over the last several years I recommend getting a cervical MRI to rule out HNP/stenosis.  She does have multilevel cervical spondylosis seen on x-ray today that goes along with her chronic complaint.  With her history of polyarthralgia I will also get blood work today to check a CBC and arthritis panel.  Follow with Dr. Louanne Skye in a couple weeks to discuss results of her cervical MRI along with her labs.  I did give her a prescription for Medrol Dosepak 6-day taper to be taken as directed.  She can continue muscle relaxer from her primary care physician.  I did advise her to discontinue any oral NSAIDs while taking the prednisone taper.  Follow-Up Instructions: Return in about 3 weeks (around 07/22/2019) for with Dr Louanne Skye to review cervical MRI.   Orders:  Orders Placed This Encounter  Procedures  . XR Shoulder Left  . XR Shoulder Right  . XR Cervical Spine 2 or 3 views  . MR Cervical Spine w/o contrast  . CBC  . Antinuclear Antib (ANA)  . Rheumatoid Factor  . Uric acid  . Sed Rate (ESR)   Meds ordered this encounter  Medications  . methylPREDNISolone (MEDROL) 4 MG tablet    Sig: 6 day taper to be taken as directed.    Dispense:  21 tablet    Refill:  0      Procedures: No procedures performed   Clinical Data: No additional findings.   Subjective: Chief  Complaint  Patient presents with  . Right Shoulder - Pain  . Left Shoulder - Pain  . Neck - Pain    HPI 54 year old black female who is new patient the office comes in with complaints of chronic neck pain, bilateral shoulder and scapular pain.  Patient has had these problems off and on since 2008 but they have been progressively worsening over the last couple years or so.  States that the left shoulder worse than the right.  Neck and shoulder pain described as being constant.  Pain radiates to the left greater than right trapezius, scapula and lateral shoulders.  She describes occipital pain as well with headaches.  She is followed by Dr. Amil Amen primary care physician.  Patient has not had any recent imaging studies by her PCP.  Primary care physician has been providing Flexeril for spasms.  Patient is also been taking oral NSAIDs and has also used tramadol in the past.  No relief with these medications.  Her last several years she has had visits to the ER and urgent care again without any improvement.  States that last year she did go to a couple of formal PT visits but this actually aggravated her pain so she stopped the treatments.  PCP again recommended formal PT in January  2020 but patient was unable to go due to having to help some family members but also states she was reluctant due to it being ineffective several months prior.  She denies any numbness tingling or weakness in the upper or lower extremities.  No gait disturbance.  Pain has significantly affected her quality of life.  Frequently awakens her at night.  Along with the bilateral shoulder pain patient also has a chronic history of bilateral left greater than right knee pain.  Patient denies family or personal history of inflammatory arthropathy. Review of Systems No current cardiac pulmonary GI GU issues  Objective: Vital Signs: Ht _0  (1.651 m)   Wt 175 lb (79.4 kg)   LMP 12/14/2006 (Approximate)   BMI 29.12 kg/m   Physical  Exam Constitutional:      Appearance: Normal appearance.  HENT:     Head: Normocephalic and atraumatic.  Eyes:     Extraocular Movements: Extraocular movements intact.     Pupils: Pupils are equal, round, and reactive to light.  Pulmonary:     Effort: Pulmonary effort is normal. No respiratory distress.  Musculoskeletal:     Comments: Gait is normal.  Patient has some limitation in cervical spine range of motion due to pain and stiffness.  Some discomfort with left Spurling test.  Slight improvement of her bilateral shoulder and scapular discomfort with cervical distraction.  Marked left brachial plexus tenderness.  Moderate bilateral trapezius and scapular tenderness.  Shoulder exam good range of motion.  Negative impingement test.  Negative drop arm test.  Bilateral elbows and wrist unremarkable.  Bilateral hips good range of motion.  Bile knees good range of motion.  Left knee positive patellofemoral crepitus and positive patellar grind.  Bilateral knee ligaments are stable.  Bilateral calves are nontender.  Neurovascularly intact and no focal motor deficits throughout.  Skin:    General: Skin is warm and dry.  Neurological:     General: No focal deficit present.     Mental Status: She is alert and oriented to person, place, and time.  Psychiatric:        Mood and Affect: Mood normal.        Behavior: Behavior normal.     Ortho Exam  Specialty Comments:  No specialty comments available.  Imaging: Xr Cervical Spine 2 Or 3 Views  Result Date: 07/01/2019 X-ray cervical spine AP lateral view shows straightening of the normal cervical lordosis.  Multilevel cervical spondylosis C3-C7.  Most difficult finding at C4-C7 with disc base collapse and vertebral spurs.  No acute findings.  Xr Shoulder Left  Result Date: 07/01/2019 X-ray left shoulder shows good bony anatomy.  No acute changes.  Xr Shoulder Right  Result Date: 07/01/2019 X-ray right shoulder shows mild to moderate  acromioclavicular degenerative changes.  No acute finding.    PMFS History: Patient Active Problem List   Diagnosis Date Noted  . Seasonal allergies   . Hyperlipidemia 03/28/2017  . 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 Chronic pain of both shoulders 03/28/2017  . Allergy   . Hypertension 10/15/2016  . Obesity 10/15/2016   Past Medical History:  Diagnosis Date  . Hyperlipidemia 03/28/2017  . Hypertension 2018  . Obesity 2018  . 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 Seasonal allergies   . Shoulder pain, bilateral 03/28/2017  . Sinusitis     Family History  Problem Relation Age of Onset  . Hypertension Mother   . Stroke Mother   . Cancer  Father        lung cancer--smoker  . Hypertension Sister   . Hypertension Brother   . Diabetes Brother   . Diabetes Sister   . Hypertension Sister        peripheral neuropathy  . Peripheral vascular disease Sister   . Hypertension Sister   . Seizures Sister   . Hypertension Sister   . Hypertension Sister   . Hypertension Sister   . Hypertension Sister   . Diabetes Sister   . Hypertension Brother   . Alcohol abuse Brother   . Cirrhosis Brother   . Hypertension Brother   . Alcohol abuse Brother   . Seizures Brother   . Stroke Brother   . Stroke Brother   . Hypertension Brother   . Hypertension Brother     Past Surgical History:  Procedure Laterality Date  . CHOLECYSTECTOMY  2001   laparoscopic  . TUBAL LIGATION  1993   Social History   Occupational History  . Occupation: Pensions consultant at [employer]  Tobacco Use  . Smoking status: Never Smoker  . Smokeless tobacco: Never Used  Substance and Sexual Activity  . Alcohol use: Yes    Comment: rare  . Drug use: No  . Sexual activity: Yes    Birth control/protection: Post-menopausal

## 2019-07-03 LAB — CBC
HCT: 38.6 % (ref 35.0–45.0)
Hemoglobin: 12.4 g/dL (ref 11.7–15.5)
MCH: 28.4 pg (ref 27.0–33.0)
MCHC: 32.1 g/dL (ref 32.0–36.0)
MCV: 88.3 fL (ref 80.0–100.0)
MPV: 11.8 fL (ref 7.5–12.5)
Platelets: 308 10*3/uL (ref 140–400)
RBC: 4.37 10*6/uL (ref 3.80–5.10)
RDW: 13.4 % (ref 11.0–15.0)
WBC: 9.5 10*3/uL (ref 3.8–10.8)

## 2019-07-03 LAB — URIC ACID: Uric Acid, Serum: 5.4 mg/dL (ref 2.5–7.0)

## 2019-07-03 LAB — RHEUMATOID FACTOR: Rhuematoid fact SerPl-aCnc: <14 IU/mL (ref <14)

## 2019-07-03 LAB — ANA: Anti Nuclear Antibody (ANA): POSITIVE — AB

## 2019-07-03 LAB — ANTI-NUCLEAR AB-TITER (ANA TITER): ANA Titer 1: 1:40 {titer} — ABNORMAL HIGH

## 2019-07-03 LAB — SEDIMENTATION RATE: Sed Rate: 11 mm/h (ref 0–30)

## 2019-07-07 ENCOUNTER — Other Ambulatory Visit: Payer: Self-pay

## 2019-07-07 ENCOUNTER — Other Ambulatory Visit (INDEPENDENT_AMBULATORY_CARE_PROVIDER_SITE_OTHER): Payer: Self-pay

## 2019-07-07 DIAGNOSIS — Z79899 Other long term (current) drug therapy: Secondary | ICD-10-CM

## 2019-07-07 DIAGNOSIS — E782 Mixed hyperlipidemia: Secondary | ICD-10-CM

## 2019-07-08 LAB — CBC WITH DIFFERENTIAL/PLATELET
Basophils Absolute: 0.1 10*3/uL (ref 0.0–0.2)
Basos: 1 %
EOS (ABSOLUTE): 0.1 10*3/uL (ref 0.0–0.4)
Eos: 1 %
Hematocrit: 37.5 % (ref 34.0–46.6)
Hemoglobin: 12.2 g/dL (ref 11.1–15.9)
Immature Grans (Abs): 0 10*3/uL (ref 0.0–0.1)
Immature Granulocytes: 0 %
Lymphocytes Absolute: 4.4 10*3/uL — ABNORMAL HIGH (ref 0.7–3.1)
Lymphs: 27 %
MCH: 28.2 pg (ref 26.6–33.0)
MCHC: 32.5 g/dL (ref 31.5–35.7)
MCV: 87 fL (ref 79–97)
Monocytes Absolute: 1.4 10*3/uL — ABNORMAL HIGH (ref 0.1–0.9)
Monocytes: 9 %
Neutrophils Absolute: 10.3 10*3/uL — ABNORMAL HIGH (ref 1.4–7.0)
Neutrophils: 62 %
Platelets: 316 10*3/uL (ref 150–450)
RBC: 4.33 x10E6/uL (ref 3.77–5.28)
RDW: 13.3 % (ref 11.7–15.4)
WBC: 16.4 10*3/uL — ABNORMAL HIGH (ref 3.4–10.8)

## 2019-07-08 LAB — COMPREHENSIVE METABOLIC PANEL
ALT: 27 IU/L (ref 0–32)
AST: 18 IU/L (ref 0–40)
Albumin/Globulin Ratio: 1.6 (ref 1.2–2.2)
Albumin: 4.6 g/dL (ref 3.8–4.9)
Alkaline Phosphatase: 87 IU/L (ref 39–117)
BUN/Creatinine Ratio: 10 (ref 9–23)
BUN: 8 mg/dL (ref 6–24)
Bilirubin Total: 0.8 mg/dL (ref 0.0–1.2)
CO2: 24 mmol/L (ref 20–29)
Calcium: 9.6 mg/dL (ref 8.7–10.2)
Chloride: 102 mmol/L (ref 96–106)
Creatinine, Ser: 0.82 mg/dL (ref 0.57–1.00)
GFR calc Af Amer: 94 mL/min/{1.73_m2} (ref >59)
GFR calc non Af Amer: 81 mL/min/{1.73_m2} (ref >59)
Globulin, Total: 2.9 g/dL (ref 1.5–4.5)
Glucose: 84 mg/dL (ref 65–99)
Potassium: 3.8 mmol/L (ref 3.5–5.2)
Sodium: 141 mmol/L (ref 134–144)
Total Protein: 7.5 g/dL (ref 6.0–8.5)

## 2019-07-08 LAB — LIPID PANEL W/O CHOL/HDL RATIO
Cholesterol, Total: 230 mg/dL — ABNORMAL HIGH (ref 100–199)
HDL: 74 mg/dL (ref >39)
LDL Chol Calc (NIH): 136 mg/dL — ABNORMAL HIGH (ref 0–99)
Triglycerides: 115 mg/dL (ref 0–149)
VLDL Cholesterol Cal: 20 mg/dL (ref 5–40)

## 2019-07-27 ENCOUNTER — Encounter: Payer: Self-pay | Admitting: *Deleted

## 2019-07-29 ENCOUNTER — Ambulatory Visit: Payer: BLUE CROSS/BLUE SHIELD

## 2019-08-16 ENCOUNTER — Other Ambulatory Visit: Payer: Self-pay | Admitting: Internal Medicine

## 2019-08-28 ENCOUNTER — Other Ambulatory Visit: Payer: Self-pay

## 2019-08-28 ENCOUNTER — Other Ambulatory Visit (INDEPENDENT_AMBULATORY_CARE_PROVIDER_SITE_OTHER): Payer: Self-pay

## 2019-08-28 DIAGNOSIS — D72829 Elevated white blood cell count, unspecified: Secondary | ICD-10-CM

## 2019-08-29 LAB — CBC WITH DIFFERENTIAL/PLATELET
Basophils Absolute: 0.1 10*3/uL (ref 0.0–0.2)
Basos: 1 %
EOS (ABSOLUTE): 0.1 10*3/uL (ref 0.0–0.4)
Eos: 1 %
Hematocrit: 38 % (ref 34.0–46.6)
Hemoglobin: 12.1 g/dL (ref 11.1–15.9)
Immature Grans (Abs): 0 10*3/uL (ref 0.0–0.1)
Immature Granulocytes: 0 %
Lymphocytes Absolute: 2.8 10*3/uL (ref 0.7–3.1)
Lymphs: 32 %
MCH: 28.6 pg (ref 26.6–33.0)
MCHC: 31.8 g/dL (ref 31.5–35.7)
MCV: 90 fL (ref 79–97)
Monocytes Absolute: 0.8 10*3/uL (ref 0.1–0.9)
Monocytes: 9 %
Neutrophils Absolute: 5.1 10*3/uL (ref 1.4–7.0)
Neutrophils: 57 %
Platelets: 308 10*3/uL (ref 150–450)
RBC: 4.23 x10E6/uL (ref 3.77–5.28)
RDW: 13.4 % (ref 11.7–15.4)
WBC: 8.9 10*3/uL (ref 3.4–10.8)

## 2019-09-01 ENCOUNTER — Other Ambulatory Visit: Payer: Self-pay | Admitting: Internal Medicine

## 2019-09-02 ENCOUNTER — Other Ambulatory Visit: Payer: BLUE CROSS/BLUE SHIELD

## 2019-09-16 ENCOUNTER — Telehealth: Payer: Self-pay | Admitting: Internal Medicine

## 2019-09-16 ENCOUNTER — Ambulatory Visit: Payer: Self-pay

## 2019-09-16 MED ORDER — FAMOTIDINE 20 MG PO TABS: 40.0000 mg | ORAL_TABLET | Freq: Every day | ORAL | 11 refills | Status: DC

## 2019-09-16 MED ORDER — CYCLOBENZAPRINE HCL 10 MG PO TABS: ORAL_TABLET | ORAL | 0 refills | Status: DC

## 2019-09-19 ENCOUNTER — Other Ambulatory Visit: Payer: Self-pay | Admitting: Internal Medicine

## 2019-09-24 NOTE — Telephone Encounter (Signed)
Spoke with pharmay. Meds did go through

## 2019-11-11 ENCOUNTER — Other Ambulatory Visit: Payer: Self-pay | Admitting: Internal Medicine

## 2019-12-14 ENCOUNTER — Ambulatory Visit: Payer: BLUE CROSS/BLUE SHIELD | Admitting: Internal Medicine

## 2020-01-06 ENCOUNTER — Encounter: Payer: Self-pay | Admitting: Internal Medicine

## 2020-01-06 ENCOUNTER — Ambulatory Visit: Payer: BLUE CROSS/BLUE SHIELD | Admitting: Internal Medicine

## 2020-01-06 ENCOUNTER — Other Ambulatory Visit: Payer: Self-pay

## 2020-01-06 VITALS — BP 122/70 | HR 80 | Resp 12 | Ht 61.25 in | Wt 177.0 lb

## 2020-01-06 DIAGNOSIS — J302 Other seasonal allergic rhinitis: Secondary | ICD-10-CM

## 2020-01-06 DIAGNOSIS — H547 Unspecified visual loss: Secondary | ICD-10-CM

## 2020-01-06 DIAGNOSIS — M25512 Pain in left shoulder: Secondary | ICD-10-CM

## 2020-01-06 DIAGNOSIS — G8929 Other chronic pain: Secondary | ICD-10-CM

## 2020-01-06 DIAGNOSIS — M542 Cervicalgia: Secondary | ICD-10-CM

## 2020-01-06 DIAGNOSIS — I10 Essential (primary) hypertension: Secondary | ICD-10-CM

## 2020-01-06 NOTE — Patient Instructions (Signed)
Ask for Fexofenadine (Allegra) 180 mg once daily at the pharmacy Pataday eye drops 1 drop both eyes twice daily Nasonex 2 sprays each nostril once daily  Get the store brand or generic with each of these and see if helps with allergies.

## 2020-01-06 NOTE — Progress Notes (Signed)
    Subjective:    Patient ID: Sheryl Martin, female   DOB: March 20, 1966, 54 y.o.   MRN: [id number]   HPI  1.  HM:  Colon cancer screen:  Not interested in undergoing screen currently with COVID pandemic.  Would like for it to calm down more.   Same for bone density and mammogram--would like to wait.    2.  COVID vaccine:  States has refused all vaccines since age 62 yo and refusing to consider as she does not want it to make her sick.  Not interested really in discussing.    3.  Hypertension:  She is fine with refills on Amlodipine.    4.  Eyes blurry and hurt.  She would like to get her eyes checked.  Feels some of this is her sinuses.  Not using prescribed meds as they were not covered by her insurance.  5.  Shoulder pain:  Went to orthopedics.   Neck and shoulder issues are miserable for her.  She states she never heard about follow up from ortho and did not go back.  Discussed she was supposed to be set up for MR of neck and then a follow up with Dr. Louanne Skye beginning of October to discuss.  She states she did not understand this.   Later, she remembers her phone broke and did not call them with her new phone number.  She states she could have missed the letter sent out.  Current Meds  Medication Sig  . amLODipine (NORVASC) 10 MG tablet Take 1 tablet by mouth daily  . cyclobenzaprine (FLEXERIL) 10 MG tablet TAKE 1 TABLET BY MOUTH TWICE DAILY AS NEEDED FOR MUSCLE SPASM  . famotidine (PEPCID) 20 MG tablet Take 2 tablets (40 mg total) by mouth at bedtime.  Marland Kitchen ibuprofen (ADVIL,MOTRIN) 200 MG tablet Take 200 mg by mouth every 6 (six) hours as needed.   Allergies  Allergen Reactions  . Naproxen   . Robaxin [Methocarbamol]      Review of Systems    Objective:   BP 122/70 (BP Location: Left Arm, Patient Position: Sitting, Cuff Size: Normal)   Pulse 80   Resp 12   Ht 5' 1.25" (1.556 m)   Wt 177 lb (80.3 kg)   LMP 12/14/2006 (Approximate)   BMI 33.17 kg/m   Physical Exam    NAD HEENT:  PERRL, EOMI, mild conjunctival injection bilaterally.  Nasal mucosa boggy with clear discharge.  TMs pearly gray.  Throat clear. Neck:  Supple, No adenopathy Chest:  CTA CV:  RRR without murmur or rub.  Radial and DP pulses normal and equal Abd:  S, NT, No HSM or mass, + BS LE:  No edema.   Assessment & Plan  1.  HM:  Screening studies on hold per patient until Pandemic calms more.  She refuses immunizations of any sort.  2.  Hypertension:  Controlled  3.  Allergies:  Discussed can obtain meds to treat OTC:  To look for namebrands and then find generic or store brands next to them:  Fexofenadine, Pataday eye drops and Nasonex (mometasone).  To call if unable to find. Eye referral to Dr. Katy Fitch as well.  4.  Right shoulder pain:  rereferral to Ortho/Dr. Louanne Skye.

## 2020-01-13 ENCOUNTER — Ambulatory Visit (INDEPENDENT_AMBULATORY_CARE_PROVIDER_SITE_OTHER): Payer: Self-pay | Admitting: Surgery

## 2020-01-13 ENCOUNTER — Other Ambulatory Visit: Payer: Self-pay

## 2020-01-13 ENCOUNTER — Encounter: Payer: Self-pay | Admitting: Surgery

## 2020-01-13 DIAGNOSIS — M4722 Other spondylosis with radiculopathy, cervical region: Secondary | ICD-10-CM

## 2020-01-13 NOTE — Progress Notes (Signed)
54 year old white female returns to the clinic again with complaints of neck pain and left greater than right shoulder pain and upper extremity radiculopathy.  Patient was seen by me for this September 2020 and I had ordered cervical MRI because she was complaining of the same problem.  For some reason she did not have this scheduled.  She was seen by her primary care physician yesterday and advised to follow-up with me today to discuss rescheduling of the scan.  States that since September she is not any better but also not significantly worse.  Has pain constantly.  Exam Decreased cervical spine range of motion due to pain and stiffness.  Moderate to marked bilateral brachial plexus and trapezius tenderness.  Bilateral shoulders negative impingement testing.  Negative drop arm test.  Good cuff strength.  Neurologically intact.  Plan We will reorder cervical spine MRI.  Needs to follow-up with Dr. Louanne Skye 2 weeks after to discuss results and further treatment options.  All questions answered.

## 2020-01-18 ENCOUNTER — Telehealth: Payer: Self-pay | Admitting: Internal Medicine

## 2020-01-18 ENCOUNTER — Telehealth: Payer: Self-pay | Admitting: Specialist

## 2020-01-18 NOTE — Telephone Encounter (Signed)
Patient called stating received a call from here regarding a MR but patient is not sure what that was about.  Checked on pt's chart looking for any documentation  and saw patient had MR  done on 01/13/2020 but patient is not really clear on that.  Please contact patient

## 2020-01-18 NOTE — Telephone Encounter (Signed)
Patient called requesting call back from Dr. Flavia Shipper or the nurse. Patient did not specify what was needed. Patient phone number is [phone]

## 2020-01-19 NOTE — Telephone Encounter (Signed)
Left message for patient. The imaging office is who tried reaching patient and she needs to give them a return call

## 2020-01-19 NOTE — Telephone Encounter (Signed)
She states that someone called her Friday about an appt for MRI, I advised that our office was closed on Friday so it would not have been our office, I advised that it may have been the imaging center. She said she would try them.

## 2020-01-19 NOTE — Telephone Encounter (Signed)
I called and lmovm to have her to call us back and to leave a detailed message with the front desk so I can call her back.

## 2020-02-16 ENCOUNTER — Ambulatory Visit
Admission: RE | Admit: 2020-02-16 | Discharge: 2020-02-16 | Disposition: A | Discharge status: Home / Self Care | Payer: Self-pay | Source: Ambulatory Visit | Attending: Surgery | Admitting: Surgery

## 2020-02-16 DIAGNOSIS — M4722 Other spondylosis with radiculopathy, cervical region: Secondary | ICD-10-CM

## 2020-02-19 ENCOUNTER — Ambulatory Visit: Payer: Self-pay | Admitting: Specialist

## 2020-02-19 ENCOUNTER — Encounter: Payer: Self-pay | Admitting: Specialist

## 2020-02-19 ENCOUNTER — Other Ambulatory Visit: Payer: Self-pay

## 2020-02-19 VITALS — BP 140/92 | HR 66 | Ht 61.25 in | Wt 177.0 lb

## 2020-02-19 DIAGNOSIS — M25511 Pain in right shoulder: Secondary | ICD-10-CM

## 2020-02-19 DIAGNOSIS — M7542 Impingement syndrome of left shoulder: Secondary | ICD-10-CM

## 2020-02-19 DIAGNOSIS — M5030 Other cervical disc degeneration, unspecified cervical region: Secondary | ICD-10-CM

## 2020-02-19 DIAGNOSIS — M4802 Spinal stenosis, cervical region: Secondary | ICD-10-CM

## 2020-02-19 DIAGNOSIS — M25512 Pain in left shoulder: Secondary | ICD-10-CM

## 2020-02-19 MED ORDER — IBUPROFEN 800 MG PO TABS: 800.0000 mg | ORAL_TABLET | Freq: Three times a day (TID) | ORAL | 3 refills | Status: DC | PRN

## 2020-02-19 MED ORDER — GABAPENTIN 100 MG PO CAPS: 100.0000 mg | ORAL_CAPSULE | Freq: Every day | ORAL | 3 refills | Status: DC

## 2020-02-19 NOTE — Patient Instructions (Addendum)
Avoid overhead lifting and overhead use of the arms. Do not lift greater than 5 lbs. Adjust head rest in vehicle to prevent hyperextension if rear ended. Take extra precautions to avoid falling. Physical therapy to work on postural and muscle strengthening with cervical traction. Increase motrin to 800  Mg up to TID for 2 weeks then as needed. 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 Narrow pillow or cervical pillow.  Ice or heat as needed.  May consider an epidural steroid injection with pain persists but the degree of narrowing of the Cervical canal is mild and I would want to treat this without surgery.   Shoulder Exercises Ask your health care provider which exercises are safe for you. Do exercises exactly as told by your health care provider and adjust them as directed. It is normal to feel mild stretching, pulling, tightness, or discomfort as you do these exercises. Stop right away if you feel sudden pain or your pain gets worse. Do not begin these exercises until told by your health care provider. Stretching exercises External rotation and abduction This exercise is sometimes called corner stretch. This exercise rotates your arm outward (external rotation) and moves your arm out from your body (abduction). 1. Stand in a doorway with one of your feet slightly in front of the other. This is called a staggered stance. If you cannot reach your forearms to the door frame, stand facing a corner of a room. 2. Choose one of the following positions as told by your health care provider: ? Place your hands and forearms on the door frame above your head. ? Place your hands and forearms on the door frame at the height of your head. ? Place your hands on the door frame at the height of your elbows. 3. Slowly move your weight onto your front foot until you feel a stretch across your chest and in the front of your shoulders. Keep your head and chest upright and keep your abdominal muscles  tight. 4. Hold for __________ seconds. 5. To release the stretch, shift your weight to your back foot. Repeat __________ times. Complete this exercise __________ times a day. Extension, standing 1. Stand and hold a broomstick, a cane, or a similar object behind your back. ? Your hands should be a little wider than shoulder width apart. ? Your palms should face away from your back. 2. Keeping your elbows straight and your shoulder muscles relaxed, move the stick away from your body until you feel a stretch in your shoulders (extension). ? Avoid shrugging your shoulders while you move the stick. Keep your shoulder blades tucked down toward the middle of your back. 3. Hold for __________ seconds. 4. Slowly return to the starting position. Repeat __________ times. Complete this exercise __________ times a day. Range-of-motion exercises Pendulum  1. Stand near a wall or a surface that you can hold onto for balance. 2. Bend at the waist and let your left / right arm hang straight down. Use your other arm to support you. Keep your back straight and do not lock your knees. 3. Relax your left / right arm and shoulder muscles, and move your hips and your trunk so your left / right arm swings freely. Your arm should swing because of the motion of your body, not because you are using your arm or shoulder muscles. 4. Keep moving your hips and trunk so your arm swings in the following directions, as told by your health care provider: ? Side  to side. ? Forward and backward. ? In clockwise and counterclockwise circles. 5. Continue each motion for __________ seconds, or for as long as told by your health care provider. 6. Slowly return to the starting position. Repeat __________ times. Complete this exercise __________ times a day. Shoulder flexion, standing  1. Stand and hold a broomstick, a cane, or a similar object. Place your hands a little more than shoulder width apart on the object. Your left /  right hand should be palm up, and your other hand should be palm down. 2. Keep your elbow straight and your shoulder muscles relaxed. Push the stick up with your healthy arm to raise your left / right arm in front of your body, and then over your head until you feel a stretch in your shoulder (flexion). ? Avoid shrugging your shoulder while you raise your arm. Keep your shoulder blade tucked down toward the middle of your back. 3. Hold for __________ seconds. 4. Slowly return to the starting position. Repeat __________ times. Complete this exercise __________ times a day. Shoulder abduction, standing 1. Stand and hold a broomstick, a cane, or a similar object. Place your hands a little more than shoulder width apart on the object. Your left / right hand should be palm up, and your other hand should be palm down. 2. Keep your elbow straight and your shoulder muscles relaxed. Push the object across your body toward your left / right side. Raise your left / right arm to the side of your body (abduction) until you feel a stretch in your shoulder. ? Do not raise your arm above shoulder height unless your health care provider tells you to do that. ? If directed, raise your arm over your head. ? Avoid shrugging your shoulder while you raise your arm. Keep your shoulder blade tucked down toward the middle of your back. 3. Hold for __________ seconds. 4. Slowly return to the starting position. Repeat __________ times. Complete this exercise __________ times a day. Internal rotation  1. Place your left / right hand behind your back, palm up. 2. Use your other hand to dangle an exercise band, a towel, or a similar object over your shoulder. Grasp the band with your left / right hand so you are holding on to both ends. 3. Gently pull up on the band until you feel a stretch in the front of your left / right shoulder. The movement of your arm toward the center of your body is called internal rotation. ? Avoid  shrugging your shoulder while you raise your arm. Keep your shoulder blade tucked down toward the middle of your back. 4. Hold for __________ seconds. 5. Release the stretch by letting go of the band and lowering your hands. Repeat __________ times. Complete this exercise __________ times a day. Strengthening exercises External rotation  1. Sit in a stable chair without armrests. 2. Secure an exercise band to a stable object at elbow height on your left / right side. 3. Place a soft object, such as a folded towel or a small pillow, between your left / right upper arm and your body to move your elbow about 4 inches (10 cm) away from your side. 4. Hold the end of the exercise band so it is tight and there is no slack. 5. Keeping your elbow pressed against the soft object, slowly move your forearm out, away from your abdomen (external rotation). Keep your body steady so only your forearm moves. 6. Hold for __________ seconds.  7. Slowly return to the starting position. Repeat __________ times. Complete this exercise __________ times a day. Shoulder abduction  1. Sit in a stable chair without armrests, or stand up. 2. Hold a __________ weight in your left / right hand, or hold an exercise band with both hands. 3. Start with your arms straight down and your left / right palm facing in, toward your body. 4. Slowly lift your left / right hand out to your side (abduction). Do not lift your hand above shoulder height unless your health care provider tells you that this is safe. ? Keep your arms straight. ? Avoid shrugging your shoulder while you do this movement. Keep your shoulder blade tucked down toward the middle of your back. 5. Hold for __________ seconds. 6. Slowly lower your arm, and return to the starting position. Repeat __________ times. Complete this exercise __________ times a day. Shoulder extension 1. Sit in a stable chair without armrests, or stand up. 2. Secure an exercise band to  a stable object in front of you so it is at shoulder height. 3. Hold one end of the exercise band in each hand. Your palms should face each other. 4. Straighten your elbows and lift your hands up to shoulder height. 5. Step back, away from the secured end of the exercise band, until the band is tight and there is no slack. 6. Squeeze your shoulder blades together as you pull your hands down to the sides of your thighs (extension). Stop when your hands are straight down by your sides. Do not let your hands go behind your body. 7. Hold for __________ seconds. 8. Slowly return to the starting position. Repeat __________ times. Complete this exercise __________ times a day. Shoulder row 1. Sit in a stable chair without armrests, or stand up. 2. Secure an exercise band to a stable object in front of you so it is at waist height. 3. Hold one end of the exercise band in each hand. Position your palms so that your thumbs are facing the ceiling (neutral position). 4. Bend each of your elbows to a 90-degree angle (right angle) and keep your upper arms at your sides. 5. Step back until the band is tight and there is no slack. 6. Slowly pull your elbows back behind you. 7. Hold for __________ seconds. 8. Slowly return to the starting position. Repeat __________ times. Complete this exercise __________ times a day. Shoulder press-ups  1. Sit in a stable chair that has armrests. Sit upright, with your feet flat on the floor. 2. Put your hands on the armrests so your elbows are bent and your fingers are pointing forward. Your hands should be about even with the sides of your body. 3. Push down on the armrests and use your arms to lift yourself off the chair. Straighten your elbows and lift yourself up as much as you comfortably can. ? Move your shoulder blades down, and avoid letting your shoulders move up toward your ears. ? Keep your feet on the ground. As you get stronger, your feet should support less  of your body weight as you lift yourself up. 4. Hold for __________ seconds. 5. Slowly lower yourself back into the chair. Repeat __________ times. Complete this exercise __________ times a day. Wall push-ups  1. Stand so you are facing a stable wall. Your feet should be about one arm-length away from the wall. 2. Lean forward and place your palms on the wall at shoulder height. 3. Keep your  feet flat on the floor as you bend your elbows and lean forward toward the wall. 4. Hold for __________ seconds. 5. Straighten your elbows to push yourself back to the starting position. Repeat __________ times. Complete this exercise __________ times a day. This information is not intended to replace advice given to you by your health care provider. Make sure you discuss any questions you have with your health care provider. Document Revised: 01/23/2019 Document Reviewed: 10/31/2018 Else[employer] Patient Education  2020 [employer].  Shoulder Impingement Syndrome  Shoulder impingement syndrome is a condition that causes pain when connective tissues (tendons) surrounding the shoulder joint become pinched. These tendons are part of the group of muscles and tissues that help to stabilize the shoulder (rotator cuff). Beneath the rotator cuff is a fluid-filled sac (bursa) that allows the muscles and tendons to glide smoothly. The bursa may become swollen or irritated (bursitis). Bursitis, swelling in the rotator cuff tendons, or both conditions can decrease how much space is under a bone in the shoulder joint (acromion), resulting in impingement. What are the causes? Shoulder impingement syndrome may be caused by bursitis or swelling of the rotator cuff tendons, which may result from:  Repetitive overhead arm movements.  Falling onto the shoulder.  Weakness in the shoulder muscles. What increases the risk? You may be more likely to develop this condition if you:  Play sports that involve throwing, such as  baseball.  Participate in sports such as tennis, volleyball, and swimming.  Work as a Curator, Games developer, or Architect. Some people are also more likely to develop impingement syndrome because of the shape of their acromion bone. What are the signs or symptoms? The main symptom of this condition is pain on the front or side of the shoulder. The pain may:  Get worse when lifting or raising the arm.  Get worse at night.  Wake you up from sleeping.  Feel sharp when the shoulder is moved and then fade to an ache. Other symptoms may include:  Tenderness.  Stiffness.  Inability to raise the arm above shoulder level or behind the body.  Weakness. How is this diagnosed? This condition may be diagnosed based on:  Your symptoms and medical history.  A physical exam.  Imaging tests, such as: ? X-rays. ? MRI. ? Ultrasound. How is this treated? This condition may be treated by:  Resting your shoulder and avoiding all activities that cause pain or put stress on the shoulder.  Icing your shoulder.  NSAIDs to help reduce pain and swelling.  One or more injections of medicines to numb the area and reduce inflammation.  Physical therapy.  Surgery. This may be needed if nonsurgical treatments have not helped. Surgery may involve repairing the rotator cuff, reshaping the acromion, or removing the bursa. Follow these instructions at home: Managing pain, stiffness, and swelling   If directed, put ice on the injured area. ? Put ice in a plastic bag. ? Place a towel between your skin and the bag. ? Leave the ice on for 20 minutes, 2-3 times a day. Activity  Rest and return to your normal activities as told by your health care provider. Ask your health care provider what activities are safe for you.  Do exercises as told by your health care provider. General instructions  Do not use any products that contain nicotine or tobacco, such as cigarettes, e-cigarettes, and chewing  tobacco. These can delay healing. If you need help quitting, ask your health care provider.  Ask your health care provider when it is safe for you to drive.  Take over-the-counter and prescription medicines only as told by your health care provider.  Keep all follow-up visits as told by your health care provider. This is important. How is this prevented?  Give your body time to rest between periods of activity.  Be safe and responsible while being active. This will help you avoid falls.  Maintain physical fitness, including strength and flexibility. Contact a health care provider if:  Your symptoms have not improved after 1-2 months of treatment and rest.  You cannot lift your arm away from your body. Summary  Shoulder impingement syndrome is a condition that causes pain when connective tissues (tendons) surrounding the shoulder joint become pinched.  The main symptom of this condition is pain on the front or side of the shoulder.  This condition is usually treated with rest, ice, and pain medicines as needed. This information is not intended to replace advice given to you by your health care provider. Make sure you discuss any questions you have with your health care provider. Document Revised: 01/23/2019 Document Reviewed: 03/26/2018 Elsevier Patient Education  2020 [employer].

## 2020-02-19 NOTE — Progress Notes (Signed)
Office Visit Note   Patient: Sheryl Martin           Date of Birth: Feb 15, 1966           MRN: [id number] Visit Date: 02/19/2020              Requested by: Mack Hook, MD [place],  [place] [id number] PCP: Mack Hook, MD   Assessment & Plan: Visit Diagnoses:  1. Other cervical disc degeneration, unspecified cervical region   2. Spinal stenosis of cervical region   3. Bilateral shoulder pain, unspecified chronicity   4. Impingement syndrome of left shoulder     Plan:  Avoid overhead lifting and overhead use of the arms. Do not lift greater than 5 lbs. Adjust head rest in vehicle to prevent hyperextension if rear ended. Take extra precautions to avoid falling. Physical therapy to work on postural and muscle strengthening with cervical traction. Increase motrin to 800  Mg up to TID for 2 weeks then as needed. 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 Narrow pillow or cervical pillow.  Ice or heat as needed.  May consider an epidural steroid injection with pain persists but the degree of narrowing of the Cervical canal is mild and I would want to treat this without surgery.   Shoulder Exercises Ask your health care provider which exercises are safe for you. Do exercises exactly as told by your health care provider and adjust them as directed. It is normal to feel mild stretching, pulling, tightness, or discomfort as you do these exercises. Stop right away if you feel sudden pain or your pain gets worse. Do not begin these exercises until told by your health care provider. Stretching exercises External rotation and abduction This exercise is sometimes called corner stretch. This exercise rotates your arm outward (external rotation) and moves your arm out from your body (abduction). 1. Stand in a doorway with one of your feet slightly in front of the other. This is called a staggered stance. If you cannot reach your forearms to the door  frame, stand facing a corner of a room. 2. Choose one of the following positions as told by your health care provider: ? Place your hands and forearms on the door frame above your head. ? Place your hands and forearms on the door frame at the height of your head. ? Place your hands on the door frame at the height of your elbows. 3. Slowly move your weight onto your front foot until you feel a stretch across your chest and in the front of your shoulders. Keep your head and chest upright and keep your abdominal muscles tight. 4. Hold for __________ seconds. 5. To release the stretch, shift your weight to your back foot. Repeat __________ times. Complete this exercise __________ times a day. Extension, standing 1. Stand and hold a broomstick, a cane, or a similar object behind your back. ? Your hands should be a little wider than shoulder width apart. ? Your palms should face away from your back. 2. Keeping your elbows straight and your shoulder muscles relaxed, move the stick away from your body until you feel a stretch in your shoulders (extension). ? Avoid shrugging your shoulders while you move the stick. Keep your shoulder blades tucked down toward the middle of your back. 3. Hold for __________ seconds. 4. Slowly return to the starting position. Repeat __________ times. Complete this exercise __________ times a day. Range-of-motion exercises Pendulum  1. Stand near  a wall or a surface that you can hold onto for balance. 2. Bend at the waist and let your left / right arm hang straight down. Use your other arm to support you. Keep your back straight and do not lock your knees. 3. Relax your left / right arm and shoulder muscles, and move your hips and your trunk so your left / right arm swings freely. Your arm should swing because of the motion of your body, not because you are using your arm or shoulder muscles. 4. Keep moving your hips and trunk so your arm swings in the following  directions, as told by your health care provider: ? Side to side. ? Forward and backward. ? In clockwise and counterclockwise circles. 5. Continue each motion for __________ seconds, or for as long as told by your health care provider. 6. Slowly return to the starting position. Repeat __________ times. Complete this exercise __________ times a day. Shoulder flexion, standing  1. Stand and hold a broomstick, a cane, or a similar object. Place your hands a little more than shoulder width apart on the object. Your left / right hand should be palm up, and your other hand should be palm down. 2. Keep your elbow straight and your shoulder muscles relaxed. Push the stick up with your healthy arm to raise your left / right arm in front of your body, and then over your head until you feel a stretch in your shoulder (flexion). ? Avoid shrugging your shoulder while you raise your arm. Keep your shoulder blade tucked down toward the middle of your back. 3. Hold for __________ seconds. 4. Slowly return to the starting position. Repeat __________ times. Complete this exercise __________ times a day. Shoulder abduction, standing 1. Stand and hold a broomstick, a cane, or a similar object. Place your hands a little more than shoulder width apart on the object. Your left / right hand should be palm up, and your other hand should be palm down. 2. Keep your elbow straight and your shoulder muscles relaxed. Push the object across your body toward your left / right side. Raise your left / right arm to the side of your body (abduction) until you feel a stretch in your shoulder. ? Do not raise your arm above shoulder height unless your health care provider tells you to do that. ? If directed, raise your arm over your head. ? Avoid shrugging your shoulder while you raise your arm. Keep your shoulder blade tucked down toward the middle of your back. 3. Hold for __________ seconds. 4. Slowly return to the starting  position. Repeat __________ times. Complete this exercise __________ times a day. Internal rotation  1. Place your left / right hand behind your back, palm up. 2. Use your other hand to dangle an exercise band, a towel, or a similar object over your shoulder. Grasp the band with your left / right hand so you are holding on to both ends. 3. Gently pull up on the band until you feel a stretch in the front of your left / right shoulder. The movement of your arm toward the center of your body is called internal rotation. ? Avoid shrugging your shoulder while you raise your arm. Keep your shoulder blade tucked down toward the middle of your back. 4. Hold for __________ seconds. 5. Release the stretch by letting go of the band and lowering your hands. Repeat __________ times. Complete this exercise __________ times a day. Strengthening exercises External rotation  1. Sit in a stable chair without armrests. 2. Secure an exercise band to a stable object at elbow height on your left / right side. 3. Place a soft object, such as a folded towel or a small pillow, between your left / right upper arm and your body to move your elbow about 4 inches (10 cm) away from your side. 4. Hold the end of the exercise band so it is tight and there is no slack. 5. Keeping your elbow pressed against the soft object, slowly move your forearm out, away from your abdomen (external rotation). Keep your body steady so only your forearm moves. 6. Hold for __________ seconds. 7. Slowly return to the starting position. Repeat __________ times. Complete this exercise __________ times a day. Shoulder abduction  1. Sit in a stable chair without armrests, or stand up. 2. Hold a __________ weight in your left / right hand, or hold an exercise band with both hands. 3. Start with your arms straight down and your left / right palm facing in, toward your body. 4. Slowly lift your left / right hand out to your side (abduction). Do  not lift your hand above shoulder height unless your health care provider tells you that this is safe. ? Keep your arms straight. ? Avoid shrugging your shoulder while you do this movement. Keep your shoulder blade tucked down toward the middle of your back. 5. Hold for __________ seconds. 6. Slowly lower your arm, and return to the starting position. Repeat __________ times. Complete this exercise __________ times a day. Shoulder extension 1. Sit in a stable chair without armrests, or stand up. 2. Secure an exercise band to a stable object in front of you so it is at shoulder height. 3. Hold one end of the exercise band in each hand. Your palms should face each other. 4. Straighten your elbows and lift your hands up to shoulder height. 5. Step back, away from the secured end of the exercise band, until the band is tight and there is no slack. 6. Squeeze your shoulder blades together as you pull your hands down to the sides of your thighs (extension). Stop when your hands are straight down by your sides. Do not let your hands go behind your body. 7. Hold for __________ seconds. 8. Slowly return to the starting position. Repeat __________ times. Complete this exercise __________ times a day. Shoulder row 1. Sit in a stable chair without armrests, or stand up. 2. Secure an exercise band to a stable object in front of you so it is at waist height. 3. Hold one end of the exercise band in each hand. Position your palms so that your thumbs are facing the ceiling (neutral position). 4. Bend each of your elbows to a 90-degree angle (right angle) and keep your upper arms at your sides. 5. Step back until the band is tight and there is no slack. 6. Slowly pull your elbows back behind you. 7. Hold for __________ seconds. 8. Slowly return to the starting position. Repeat __________ times. Complete this exercise __________ times a day. Shoulder press-ups  1. Sit in a stable chair that has armrests.  Sit upright, with your feet flat on the floor. 2. Put your hands on the armrests so your elbows are bent and your fingers are pointing forward. Your hands should be about even with the sides of your body. 3. Push down on the armrests and use your arms to lift yourself off the chair. Straighten your  elbows and lift yourself up as much as you comfortably can. ? Move your shoulder blades down, and avoid letting your shoulders move up toward your ears. ? Keep your feet on the ground. As you get stronger, your feet should support less of your body weight as you lift yourself up. 4. Hold for __________ seconds. 5. Slowly lower yourself back into the chair. Repeat __________ times. Complete this exercise __________ times a day. Wall push-ups  1. Stand so you are facing a stable wall. Your feet should be about one arm-length away from the wall. 2. Lean forward and place your palms on the wall at shoulder height. 3. Keep your feet flat on the floor as you bend your elbows and lean forward toward the wall. 4. Hold for __________ seconds. 5. Straighten your elbows to push yourself back to the starting position. Repeat __________ times. Complete this exercise __________ times a day. This information is not intended to replace advice given to you by your health care provider. Make sure you discuss any questions you have with your health care provider. Document Revised: 01/23/2019 Document Reviewed: 10/31/2018 Elsevier Patient Education  2020 [employer].  Shoulder Impingement Syndrome  Shoulder impingement syndrome is a condition that causes pain when connective tissues (tendons) surrounding the shoulder joint become pinched. These tendons are part of the group of muscles and tissues that help to stabilize the shoulder (rotator cuff). Beneath the rotator cuff is a fluid-filled sac (bursa) that allows the muscles and tendons to glide smoothly. The bursa may become swollen or irritated (bursitis).  Bursitis, swelling in the rotator cuff tendons, or both conditions can decrease how much space is under a bone in the shoulder joint (acromion), resulting in impingement. What are the causes? Shoulder impingement syndrome may be caused by bursitis or swelling of the rotator cuff tendons, which may result from:  Repetitive overhead arm movements.  Falling onto the shoulder.  Weakness in the shoulder muscles. What increases the risk? You may be more likely to develop this condition if you:  Play sports that involve throwing, such as baseball.  Participate in sports such as tennis, volleyball, and swimming.  Work as a Curator, Games developer, or Architect. Some people are also more likely to develop impingement syndrome because of the shape of their acromion bone. What are the signs or symptoms? The main symptom of this condition is pain on the front or side of the shoulder. The pain may:  Get worse when lifting or raising the arm.  Get worse at night.  Wake you up from sleeping.  Feel sharp when the shoulder is moved and then fade to an ache. Other symptoms may include:  Tenderness.  Stiffness.  Inability to raise the arm above shoulder level or behind the body.  Weakness. How is this diagnosed? This condition may be diagnosed based on:  Your symptoms and medical history.  A physical exam.  Imaging tests, such as: ? X-rays. ? MRI. ? Ultrasound. How is this treated? This condition may be treated by:  Resting your shoulder and avoiding all activities that cause pain or put stress on the shoulder.  Icing your shoulder.  NSAIDs to help reduce pain and swelling.  One or more injections of medicines to numb the area and reduce inflammation.  Physical therapy.  Surgery. This may be needed if nonsurgical treatments have not helped. Surgery may involve repairing the rotator cuff, reshaping the acromion, or removing the bursa. Follow these instructions at  home: Managing pain,  stiffness, and swelling   If directed, put ice on the injured area. ? Put ice in a plastic bag. ? Place a towel between your skin and the bag. ? Leave the ice on for 20 minutes, 2-3 times a day. Activity  Rest and return to your normal activities as told by your health care provider. Ask your health care provider what activities are safe for you.  Do exercises as told by your health care provider. General instructions  Do not use any products that contain nicotine or tobacco, such as cigarettes, e-cigarettes, and chewing tobacco. These can delay healing. If you need help quitting, ask your health care provider.  Ask your health care provider when it is safe for you to drive.  Take over-the-counter and prescription medicines only as told by your health care provider.  Keep all follow-up visits as told by your health care provider. This is important. How is this prevented?  Give your body time to rest between periods of activity.  Be safe and responsible while being active. This will help you avoid falls.  Maintain physical fitness, including strength and flexibility. Contact a health care provider if:  Your symptoms have not improved after 1-2 months of treatment and rest.  You cannot lift your arm away from your body. Summary  Shoulder impingement syndrome is a condition that causes pain when connective tissues (tendons) surrounding the shoulder joint become pinched.  The main symptom of this condition is pain on the front or side of the shoulder.  This condition is usually treated with rest, ice, and pain medicines as needed. This information is not intended to replace advice given to you by your health care provider. Make sure you discuss any questions you have with your health care provider. Document Revised: 01/23/2019 Document Reviewed: 03/26/2018 Elsevier Patient Education  [place] Instructions: Return in about 4 weeks  (around 03/18/2020).   Orders:  Orders Placed This Encounter  Procedures  . Ambulatory referral to Physical Therapy   Meds ordered this encounter  Medications  . ibuprofen (ADVIL) 800 MG tablet    Sig: Take 1 tablet (800 mg total) by mouth every 8 (eight) hours as needed for mild pain.    Dispense:  270 tablet    Refill:  3  . gabapentin (NEURONTIN) 100 MG capsule    Sig: Take 1 capsule (100 mg total) by mouth at bedtime.    Dispense:  30 capsule    Refill:  3      Procedures: No procedures performed   Clinical Data: No additional findings.   Subjective: Chief Complaint  Patient presents with  . Neck - Follow-up    54 year old female right handed with history of neck pain and pain into the shoulders left and now into the back of the neck and the back of the shoulder. There is pain with lying on the back and on the left shoulder. It has inflamation in it, no previous injection of the left shoulder. She was on medrol dose pak and within a week of stopping the pain was back. Muscle spasm meds flexeril does nothing for the pain. There is some numbness in the left arm lateral upper arm and left lateral shoulder. She works at [employer] and works in the [employer], she loads the tubes for tooth pastes. She has been for five years, anniversary was May the 4th. No problems with clumbsiness or dropping of items. Does notice that the distance  to walk into the mill is a long way and it take about 10 minutes to walk in to the break room. Saw [facility]ros with neck pain and stiffness that has been present for about 8 months. There has been some swelling about the collar bone left side. No bowel or bladder difficulty, she has no gait disturbance. She tried PT and was seen about  One year ago on [address]. The pain seemed to worsen with her therapy.   Review of Systems  Constitutional: Negative.  Negative for activity change, appetite change, chills, diaphoresis, fatigue, fever and  unexpected weight change.  HENT: Positive for ear pain, rhinorrhea, sinus pressure, sinus pain, sneezing and sore throat. Negative for congestion, dental problem, drooling, ear discharge, facial swelling, hearing loss, mouth sores, nosebleeds, postnasal drip, tinnitus, trouble swallowing and voice change.   Eyes: Positive for pain, redness and itching. Negative for photophobia and visual disturbance.  Respiratory: Positive for shortness of breath. Negative for apnea, cough, choking, chest tightness, wheezing and stridor.   Cardiovascular: Negative.  Negative for chest pain, palpitations and leg swelling.  Gastrointestinal: Negative.  Negative for abdominal distention, abdominal pain, anal bleeding, blood in stool, constipation, diarrhea, nausea, rectal pain and vomiting.  Endocrine: Negative for cold intolerance, heat intolerance, polydipsia, polyphagia and polyuria.  Genitourinary: Positive for urgency. Negative for difficulty urinating, dyspareunia, dysuria, enuresis and flank pain.  Musculoskeletal: Positive for neck pain and neck stiffness. Negative for arthralgias, back pain, gait problem, joint swelling and myalgias.  Allergic/Immunologic: Negative for environmental allergies, food allergies and immunocompromised state.  Neurological: Negative.      Objective: Vital Signs: BP (!) 140/92   Pulse 66   Ht 5' 1.25" (1.556 m)   Wt 177 lb (80.3 kg)   LMP 12/14/2006 (Approximate)   BMI 33.17 kg/m   Physical Exam Constitutional:      Appearance: She is well-developed.  HENT:     Head: Normocephalic and atraumatic.  Eyes:     Pupils: Pupils are equal, round, and reactive to light.  Pulmonary:     Effort: Pulmonary effort is normal.     Breath sounds: Normal breath sounds.  Abdominal:     General: Bowel sounds are normal.     Palpations: Abdomen is soft.  Musculoskeletal:     Cervical back: Normal range of motion and neck supple.     Lumbar back: Negative right straight leg raise  test and negative left straight leg raise test.  Skin:    General: Skin is warm and dry.  Neurological:     Mental Status: She is alert and oriented to person, place, and time.  Psychiatric:        Behavior: Behavior normal.        Thought Content: Thought content normal.        Judgment: Judgment normal.     Back Exam   Tenderness  The patient is experiencing tenderness in the cervical.  Range of Motion  Extension:  70 abnormal  Flexion:  90 normal  Lateral bend right:  70 abnormal  Lateral bend left:  70 abnormal  Rotation right:  70 abnormal  Rotation left:  70 abnormal   Muscle Strength  Right Quadriceps:  5/5  Left Quadriceps:  5/5  Right Hamstrings:  5/5  Left Hamstrings:  5/5   Tests  Straight leg raise right: negative Straight leg raise left: negative  Other  Heel walk: normal      Specialty Comments:  No specialty comments available.  Imaging: No results found.   PMFS History: Patient Active Problem List   Diagnosis Date Noted  . Seasonal allergies   . Hyperlipidemia 03/28/2017  . 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 Chronic pain of both shoulders 03/28/2017  . Allergy   . Hypertension 10/15/2016  . Obesity 10/15/2016   Past Medical History:  Diagnosis Date  . Hyperlipidemia 03/28/2017  . Hypertension 2018  . Obesity 2018  . 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 Seasonal allergies   . Shoulder pain, bilateral 03/28/2017  . Sinusitis     Family History  Problem Relation Age of Onset  . Hypertension Mother   . Stroke Mother   . Cancer Father        lung cancer--smoker  . Hypertension Sister   . Hypertension Brother   . Diabetes Brother   . Diabetes Sister   . Hypertension Sister        peripheral neuropathy  . Peripheral vascular disease Sister   . Hypertension Sister   . Seizures Sister   . Hypertension Sister   . Hypertension Sister   . Hypertension Sister   . Hypertension Sister   . Diabetes Sister    . Hypertension Brother   . Alcohol abuse Brother   . Cirrhosis Brother   . Hypertension Brother   . Alcohol abuse Brother   . Seizures Brother   . Stroke Brother   . Stroke Brother   . Hypertension Brother   . Hypertension Brother     Past Surgical History:  Procedure Laterality Date  . CHOLECYSTECTOMY  2001   laparoscopic  . TUBAL LIGATION  1993   Social History   Occupational History  . Occupation: Pensions consultant at [employer]  Tobacco Use  . Smoking status: Never Smoker  . Smokeless tobacco: Never Used  Substance and Sexual Activity  . Alcohol use: Yes    Comment: rare  . Drug use: No  . Sexual activity: Yes    Birth control/protection: Post-menopausal

## 2020-03-07 ENCOUNTER — Other Ambulatory Visit: Payer: Self-pay

## 2020-03-07 ENCOUNTER — Ambulatory Visit: Payer: Self-pay | Attending: Specialist

## 2020-03-07 DIAGNOSIS — G8929 Other chronic pain: Secondary | ICD-10-CM | POA: Insufficient documentation

## 2020-03-07 DIAGNOSIS — M25511 Pain in right shoulder: Secondary | ICD-10-CM | POA: Insufficient documentation

## 2020-03-07 DIAGNOSIS — R252 Cramp and spasm: Secondary | ICD-10-CM | POA: Insufficient documentation

## 2020-03-07 DIAGNOSIS — M25512 Pain in left shoulder: Secondary | ICD-10-CM | POA: Insufficient documentation

## 2020-03-07 DIAGNOSIS — R293 Abnormal posture: Secondary | ICD-10-CM | POA: Insufficient documentation

## 2020-03-07 DIAGNOSIS — M542 Cervicalgia: Secondary | ICD-10-CM | POA: Insufficient documentation
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
## 2020-03-07 NOTE — Patient Instructions (Signed)
Cervical rotation , chin tuck , nodding scap retraction  3-5 rep 3 x/day   Hold 1-5 sec

## 2020-03-21 ENCOUNTER — Ambulatory Visit: Payer: Self-pay

## 2020-03-23 ENCOUNTER — Ambulatory Visit: Payer: Self-pay | Admitting: Specialist

## 2020-03-25 ENCOUNTER — Ambulatory Visit: Payer: Self-pay | Admitting: Physical Therapy

## 2020-03-28 ENCOUNTER — Ambulatory Visit: Payer: Self-pay | Attending: Specialist

## 2020-03-28 ENCOUNTER — Telehealth: Payer: Self-pay | Admitting: Physical Therapy

## 2020-03-28 DIAGNOSIS — M542 Cervicalgia: Secondary | ICD-10-CM | POA: Insufficient documentation

## 2020-03-28 DIAGNOSIS — R293 Abnormal posture: Secondary | ICD-10-CM | POA: Insufficient documentation

## 2020-03-28 DIAGNOSIS — G8929 Other chronic pain: Secondary | ICD-10-CM | POA: Insufficient documentation

## 2020-03-28 DIAGNOSIS — M25511 Pain in right shoulder: Secondary | ICD-10-CM | POA: Insufficient documentation

## 2020-03-28 DIAGNOSIS — M25512 Pain in left shoulder: Secondary | ICD-10-CM | POA: Insufficient documentation

## 2020-03-28 NOTE — Telephone Encounter (Signed)
Message left about today's missed appointment and the next appointment on 04/04/20 at [facility] and clinic phone number included in message.

## 2020-04-04 ENCOUNTER — Ambulatory Visit: Payer: Self-pay

## 2020-04-04 ENCOUNTER — Other Ambulatory Visit: Payer: Self-pay

## 2020-04-04 DIAGNOSIS — G8929 Other chronic pain: Secondary | ICD-10-CM

## 2020-04-04 DIAGNOSIS — R293 Abnormal posture: Secondary | ICD-10-CM

## 2020-04-04 DIAGNOSIS — M25512 Pain in left shoulder: Secondary | ICD-10-CM

## 2020-04-04 DIAGNOSIS — M542 Cervicalgia: Secondary | ICD-10-CM

## 2020-04-04 NOTE — Therapy (Addendum)
[place], [place], [address] Phone: [phone]   Fax:  [phone]  Physical Therapy Treatment/Discharge  Patient Details  Name: Sheryl Martin MRN: [id number] Date of Birth: [birth year] Referring Provider (PT): Basil Dess, MD   Encounter Date: 04/04/2020   PT End of Session - 04/04/20 1001    Visit Number 2    Number of Visits 12    Date for PT Re-Evaluation 04/15/20    Authorization Type Self pay    PT Start Time 1000    PT Stop Time 1050    PT Time Calculation (min) 50 min    Activity Tolerance Patient tolerated treatment well    Behavior During Therapy [facility] for tasks assessed/performed           Past Medical History:  Diagnosis Date  . Hyperlipidemia 03/28/2017  . Hypertension 2018  . Obesity 2018  . 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 Seasonal allergies   . Shoulder pain, bilateral 03/28/2017  . Sinusitis     Past Surgical History:  Procedure Laterality Date  . CHOLECYSTECTOMY  2001   laparoscopic  . TUBAL LIGATION  1993    There were no vitals filed for this visit.   Subjective Assessment - 04/04/20 1000    Subjective No change in past month.  taking meds    Currently in Pain? Yes    Pain Score 3     Pain Location Neck    Pain Orientation Right;Left;Posterior    Pain Descriptors / Indicators Aching    Pain Type Chronic pain    Pain Onset More than a month ago    Pain Frequency Constant    Aggravating Factors  activity    Pain Relieving Factors medication                             [facility] Adult PT Treatment/Exercise - 04/04/20 0001      Exercises   Exercises Neck      Neck Exercises: Standing   Other Standing Exercises red band row and extension standing x 15 reps bilateral      Neck Exercises: Seated   Neck Retraction 10 reps    Neck Retraction Limitations assisted by PT    Cervical Rotation Right;Left;5 reps    Cervical Rotation  Limitations straight rotation and with flexion x 5 RT/LT assisted by PT     Lateral Flexion Right;Left;5 reps    Lateral Flexion Limitations assisted by PT      Modalities   Modalities Moist Heat;Traction      Moist Heat Therapy   Number Minutes Moist Heat 10 Minutes    Moist Heat Location Cervical      Traction   Type of Traction Cervical    Min (lbs) 5    Max (lbs) 12    Hold Time 60    Rest Time 15    Time 12      Manual Therapy   Manual Therapy Passive ROM;Soft tissue mobilization;Joint mobilization    Joint Mobilization PA glides cervical and upper thoracic    Soft tissue mobilization cervical soft tissues    Passive ROM neck rotation and sidebending                  PT Education - 04/04/20 1046    Education Details HEP    Person(s) Educated Patient    Methods Explanation;Demonstration;Tactile cues;Verbal cues;Handout  Comprehension Verbalized understanding;Returned demonstration            PT Short Term Goals - 03/07/20 1003      PT SHORT TERM GOAL #1   Title She will be independent with intinal HEP    Time 3    Period Weeks    Status New      PT SHORT TERM GOAL #2   Title She will report pain iumproved overall 20% or bettter    Time 3    Period Weeks    Status New      PT SHORT TERM GOAL #3   Title She will demo understanding of good posture    Time 3    Period Weeks    Status New      PT SHORT TERM GOAL #4   Title FOTO report discussed with pt by visit 3    Time 2    Period Weeks    Status New             PT Long Term Goals - 03/07/20 1005      PT LONG TERM GOAL #1   Title She will be indepndent with all HEP issued    Time 6    Period Weeks    Status New      PT LONG TERM GOAL #2   Title She will report pain improved overall 40-50% with home and work activity.    Time 6    Period Weeks    Status New      PT LONG TERM GOAL #3   Title FOTO score improved to 41% linmited from 55% limtied  visit 12    Time 6    Period  Weeks    Status New      PT LONG TERM GOAL #4   Title cervical rotation improved to 60 degrees to dec stress to neck    Time 6    Period Weeks    Status New                 Plan - 04/04/20 1001    PT Treatment/Interventions Passive range of motion;Dry needling;Manual techniques;Therapeutic activities;Therapeutic exercise;Electrical Stimulation;Iontophoresis 49m/ml Dexamethasone;Moist Heat;Traction;Ultrasound;Patient/family education    PT Next Visit Plan assess traction , continue stretches , band exerises , naual /heat    PT Home Exercise Plan cervical and scapula retraction .  nodding,  cervical rotation    Consulted and Agree with Plan of Care Patient           Patient will benefit from skilled therapeutic intervention in order to improve the following deficits and impairments:  Pain, Postural dysfunction, Decreased activity tolerance, Increased muscle spasms, Decreased range of motion  Visit Diagnosis: Cervicalgia  Chronic right shoulder pain  Chronic left shoulder pain  Abnormal posture     Problem List Patient Active Problem List   Diagnosis Date Noted  . Seasonal allergies   . Hyperlipidemia 03/28/2017  . 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 Chronic pain of both shoulders 03/28/2017  . Allergy   . Hypertension 10/15/2016  . Obesity 10/15/2016    CDarrel Hoover PT 04/04/2020, 10:48 AM  [facility] [place] [address]Phone: [phone]  Fax:  [phone] Name: Sheryl BickleMRN: [id number] of Birth: [birth year] PHYSICAL THERAPY DISCHARGE SUMMARY  Visits from Start of Care: 2  Current functional level related to goals / functional outcomes: Unknown She no  showed 3 appointments and no showed 3 appointment   Remaining deficits: Unknown   Education / Equipment: HEP Plan:                                                    Patient goals were  not met. Patient is being discharged due to not returning since the last visit.  ?????   Pearson Forster   PT  04/25/20

## 2020-04-04 NOTE — Patient Instructions (Signed)
Shoulder extension and row red band x 15 reps 1-3 sec hold daily

## 2020-04-08 ENCOUNTER — Ambulatory Visit: Payer: Self-pay | Admitting: Physical Therapy

## 2020-04-11 ENCOUNTER — Ambulatory Visit: Payer: Self-pay | Admitting: Physical Therapy

## 2020-04-11 ENCOUNTER — Telehealth: Payer: Self-pay | Admitting: Physical Therapy

## 2020-04-11 NOTE — Telephone Encounter (Signed)
Left voicemail regarding no show to appointment. Left next visit time and asked that she call to cancel or reschedule if needed.

## 2020-04-15 ENCOUNTER — Ambulatory Visit: Payer: Self-pay | Attending: Specialist | Admitting: Physical Therapy

## 2020-06-15 ENCOUNTER — Other Ambulatory Visit: Payer: Self-pay | Admitting: Internal Medicine

## 2020-07-08 ENCOUNTER — Encounter: Payer: Self-pay | Admitting: Internal Medicine

## 2020-08-22 ENCOUNTER — Encounter: Payer: Self-pay | Admitting: Internal Medicine

## 2020-08-22 ENCOUNTER — Other Ambulatory Visit: Payer: Self-pay

## 2020-08-22 ENCOUNTER — Ambulatory Visit (INDEPENDENT_AMBULATORY_CARE_PROVIDER_SITE_OTHER): Payer: Self-pay | Admitting: Internal Medicine

## 2020-08-22 ENCOUNTER — Other Ambulatory Visit: Payer: Self-pay | Admitting: Internal Medicine

## 2020-08-22 VITALS — BP 131/85 | HR 77 | Resp 12 | Ht 61.25 in | Wt 189.0 lb

## 2020-08-22 DIAGNOSIS — E782 Mixed hyperlipidemia: Secondary | ICD-10-CM

## 2020-08-22 DIAGNOSIS — I10 Essential (primary) hypertension: Secondary | ICD-10-CM

## 2020-08-22 DIAGNOSIS — Z124 Encounter for screening for malignant neoplasm of cervix: Secondary | ICD-10-CM

## 2020-08-22 DIAGNOSIS — Z0000 Encounter for general adult medical examination without abnormal findings: Secondary | ICD-10-CM

## 2020-08-22 DIAGNOSIS — E66812 Obesity, class 2: Secondary | ICD-10-CM

## 2020-08-22 DIAGNOSIS — Z9109 Other allergy status, other than to drugs and biological substances: Secondary | ICD-10-CM

## 2020-08-22 DIAGNOSIS — H547 Unspecified visual loss: Secondary | ICD-10-CM

## 2020-08-22 DIAGNOSIS — Z23 Encounter for immunization: Secondary | ICD-10-CM

## 2020-08-22 DIAGNOSIS — Z6835 Body mass index (BMI) 35.0-35.9, adult: Secondary | ICD-10-CM

## 2020-08-22 DIAGNOSIS — Z1231 Encounter for screening mammogram for malignant neoplasm of breast: Secondary | ICD-10-CM

## 2020-08-22 DIAGNOSIS — J302 Other seasonal allergic rhinitis: Secondary | ICD-10-CM

## 2020-08-22 LAB — POCT WET PREP WITH KOH
KOH Prep POC: NEGATIVE
RBC Wet Prep HPF POC: NEGATIVE
Trichomonas, UA: NEGATIVE
Yeast Wet Prep HPF POC: NEGATIVE

## 2020-08-22 MED ORDER — FLUTICASONE PROPIONATE 50 MCG/ACT NA SUSP
2.0000 | Freq: Every day | NASAL | 11 refills | Status: DC
Start: 2020-08-22 — End: 2021-11-13

## 2020-08-22 MED ORDER — OLOPATADINE HCL 0.2 % OP SOLN
OPHTHALMIC | 11 refills | Status: DC
Start: 2020-08-22 — End: 2021-11-13

## 2020-08-22 MED ORDER — CETIRIZINE HCL 10 MG PO TABS
10.0000 mg | ORAL_TABLET | Freq: Every day | ORAL | 11 refills | Status: DC
Start: 2020-08-22 — End: 2021-11-13

## 2020-08-22 MED ORDER — GABAPENTIN 100 MG PO CAPS
100.0000 mg | ORAL_CAPSULE | Freq: Every day | ORAL | 3 refills | Status: DC
Start: 2020-08-22 — End: 2021-11-13

## 2020-08-22 NOTE — Progress Notes (Signed)
Subjective:    Patient ID: Sheryl Martin, female   DOB: 05/27/1966, 54 y.o.   MRN: [id number]   HPI   CPE with pap  1.  Pap:  Last pap was 01/14/2017 and normal.  2.  Mammogram:  Last mammogram was 07/17/2018 and normal.  No family history of breast cancer.  3.  Osteoprevention:  Not drinking or eating much in way of dairy or calcium and Vitamin D fortified foods.  Walks 2 miles to and from her job 3 days weekly.   4.  Guaiac Cards:  Last performed 06/2019 and negative x 3.    5.  Colonoscopy:  Never.  No family history of colon cancer.    6.  Immunizations:  Has not had COVID vaccine.  She is ready to obtain.  She has not had influenza vaccine.  Tdap up to date.    7.  Glucose/Cholesterol:  Blood glucose has been normal in past and cholesterol a bit high, but had a very high HDL.    Lipid Panel     Component Value Date/Time   CHOL 230 (H) 07/07/2019 0829   TRIG 115 07/07/2019 0829   HDL 74 07/07/2019 0829   LDLCALC 136 (H) 07/07/2019 0829   LABVLDL 20 07/07/2019 0829   Current Meds  Medication Sig   amLODipine (NORVASC) 10 MG tablet Take 1 tablet by mouth daily   cyclobenzaprine (FLEXERIL) 10 MG tablet TAKE 1/2 TO 1 (ONE-HALF TO ONE) TABLET BY MOUTH TWICE DAILY AS NEEDED FOR MUSCLE SPASM   famotidine (PEPCID) 20 MG tablet Take 2 tablets (40 mg total) by mouth at bedtime.   fluticasone (FLONASE) 50 MCG/ACT nasal spray Place 2 sprays into both nostrils daily.   gabapentin (NEURONTIN) 100 MG capsule Take 1 capsule (100 mg total) by mouth at bedtime.   ibuprofen (ADVIL) 800 MG tablet Take 1 tablet (800 mg total) by mouth every 8 (eight) hours as needed for mild pain.   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    Seasonal allergies    Shoulder pain, bilateral 03/28/2017   Sinusitis    Past Surgical History:  Procedure Laterality Date   CHOLECYSTECTOMY  2001   laparoscopic   TUBAL LIGATION  1993   Family History  Problem Relation Age of Onset   Hypertension Mother    Stroke Mother    Cancer Father        lung cancer--smoker   Hypertension Sister    Hypertension Brother    Diabetes Brother    Diabetes Sister    Hypertension Sister        peripheral neuropathy   Peripheral vascular disease Sister    Hypertension Sister    Seizures Sister    Hypertension Sister    Hypertension Sister    Hypertension Sister    Hypertension Sister    Diabetes Sister    Hypertension Brother    Alcohol abuse Brother    Cirrhosis Brother    Hypertension Brother    Alcohol abuse Brother    Seizures Brother    Stroke Brother        cause of death   Stroke Brother    Hypertension Brother  Hypertension Brother    Social History   Socioeconomic History   Marital status: Significant Other    Spouse name: Not on file   Number of children: 2   Years of education: 11   Highest education level: Not on file  Occupational History   Occupation: packaging at [employer]  Tobacco Use   Smoking status: Never   Smokeless tobacco: Never  Vaping Use   Vaping Use: Never used  Substance and Sexual Activity   Alcohol use: Yes    Comment: rare   Drug use: No   Sexual activity: Yes    Birth control/protection: Post-menopausal  Other Topics Concern   Not on file  Social History Narrative   Originally from [place].   Moved to [facility] 2014   Son is still in college and playing football at some level (not in college, however)   Son lives with her part time.   Daughter and her family live nearby.   Social Determinants of Health   Financial Resource Strain: Not on file  Food Insecurity: Not on file  Transportation Needs: Not on file  Physical Activity: Not on file  Stress: Not on file   Social Connections: Not on file  Intimate Partner Violence: Not on file     Review of Systems  Constitutional: Negative for appetite change and fatigue.  HENT: Positive for sinus pain. Negative for dental problem and rhinorrhea.   Eyes: Positive for itching.  Respiratory: Negative for cough and shortness of breath.   Cardiovascular: Negative for chest pain, palpitations and leg swelling.  Gastrointestinal: Negative for abdominal pain, blood in stool (No melena.), constipation and diarrhea.  Genitourinary: Negative for dysuria and vaginal discharge.  Musculoskeletal: Positive for arthralgias (Shoulders still with pain.  She does have PT exercises she continues to try and do daily.  Sounds as if she is a bit too vigorous with them.  They hurt oftentimes.).  Skin: Negative for rash.  Neurological: Negative for weakness and numbness.  Psychiatric/Behavioral: Negative for dysphoric mood. The patient is not nervous/anxious.       Objective:   BP 131/85 (BP Location: Left Arm, Patient Position: Sitting)   Pulse 77   Resp 12   Ht 5' 1.25" (1.556 m)   Wt 189 lb (85.7 kg)   LMP 12/14/2006 (Approximate)   BMI 35.42 kg/m   Physical Exam HENT:     Head: Normocephalic and atraumatic.     Right Ear: Tympanic membrane, ear canal and external ear normal.     Left Ear: Tympanic membrane, ear canal and external ear normal.     Nose: Mucosal edema and rhinorrhea (Clear) present.     Mouth/Throat:     Mouth: Mucous membranes are moist.     Pharynx: Oropharynx is clear.  Eyes:     Extraocular Movements: Extraocular movements intact.     Conjunctiva/sclera:     Right eye: Right conjunctiva is injected (mild).     Left eye: Left conjunctiva is injected (mild).     Pupils: Pupils are equal, round, and reactive to light.     Comments: Discs sharp bilaterally  Cardiovascular:     Rate and Rhythm: Normal rate and regular rhythm.     Heart sounds: S1 normal and S2 normal. No murmur heard.    No friction rub. No S3 or S4 sounds.     Comments: No carotid bruits.  Carotid, radial, femoral, DP and PT pulses normal and equal.    Pulmonary:  Effort: Pulmonary effort is normal.     Breath sounds: Normal breath sounds.  Chest:  Breasts:    Right: No inverted nipple, mass, nipple discharge or tenderness.     Left: No inverted nipple, mass, skin change or tenderness.  Abdominal:     General: Bowel sounds are normal.     Palpations: Abdomen is soft. There is no hepatomegaly, splenomegaly or mass.     Tenderness: There is no abdominal tenderness.     Hernia: No hernia is present.  Genitourinary:    Comments: Normal external female genitalia. Scant vaginal discharge.   No inflammation of cervical or vaginal mucosa. No uterine or adnexal mass or tenderness. Musculoskeletal:        General: Normal range of motion.     Cervical back: Normal range of motion and neck supple.     Right lower leg: No edema.     Left lower leg: No edema.  Lymphadenopathy:     Head:     Right side of head: No submental or submandibular adenopathy.     Left side of head: No submental or submandibular adenopathy.     Cervical: No cervical adenopathy.     Upper Body:     Right upper body: No supraclavicular or axillary adenopathy.     Left upper body: No supraclavicular or axillary adenopathy.     Lower Body: No right inguinal adenopathy. No left inguinal adenopathy.  Skin:    General: Skin is warm and dry.  Neurological:     General: No focal deficit present.     Mental Status: She is alert and oriented to person, place, and time.     Cranial Nerves: Cranial nerves 2-12 are intact.     Sensory: Sensation is intact.     Motor: Motor function is intact.     Coordination: Coordination is intact.     Gait: Gait is intact.     Deep Tendon Reflexes: Reflexes are normal and symmetric.  Psychiatric:        Attention and Perception: Attention normal.        Mood and Affect: Mood and affect normal.         Speech: Speech normal.        Behavior: Behavior normal. Behavior is cooperative.     Assessment & Plan   1.  CPE with pap Mammogram ordered Calcium 500-600 mg with vitamin D twice daily Regular physical exercise daily Return for fasting labs:  FLP, CBC, CMP Influenza vaccine COVID vaccine #1 today.  2.  Bacterial Vaginosis:  No symptoms.  No treatment for now.  3.  Obesity:  To work on diet and physical activity as above.  4.  Decreased visual acuity:  Referral to [facility]  5.  Allergies:  Zyrtec, Flonase,  and Olapatadine eye drops  6.  Hypertension:  BP with fair control.

## 2020-08-22 NOTE — Patient Instructions (Signed)
Citrated calcium 500 to 600 mg Calcium with 200 IU Vitamin D twice daily.

## 2020-08-25 LAB — CYTOLOGY - PAP

## 2020-09-02 ENCOUNTER — Other Ambulatory Visit: Payer: Self-pay

## 2020-09-05 ENCOUNTER — Other Ambulatory Visit (INDEPENDENT_AMBULATORY_CARE_PROVIDER_SITE_OTHER): Payer: Self-pay

## 2020-09-05 DIAGNOSIS — Z1211 Encounter for screening for malignant neoplasm of colon: Secondary | ICD-10-CM

## 2020-09-05 LAB — POC HEMOCCULT BLD/STL (HOME/3-CARD/SCREEN)
Card #2 Fecal Occult Blod, POC: NEGATIVE
Card #3 Fecal Occult Blood, POC: NEGATIVE
Fecal Occult Blood, POC: NEGATIVE

## 2020-09-28 ENCOUNTER — Other Ambulatory Visit: Payer: Self-pay | Admitting: Internal Medicine

## 2020-10-21 ENCOUNTER — Other Ambulatory Visit: Payer: Self-pay

## 2020-10-27 ENCOUNTER — Other Ambulatory Visit: Payer: Self-pay | Admitting: Internal Medicine

## 2020-12-12 ENCOUNTER — Other Ambulatory Visit (INDEPENDENT_AMBULATORY_CARE_PROVIDER_SITE_OTHER): Payer: Self-pay | Admitting: Internal Medicine

## 2020-12-12 ENCOUNTER — Other Ambulatory Visit: Payer: Self-pay

## 2020-12-12 DIAGNOSIS — Z9229 Personal history of other drug therapy: Secondary | ICD-10-CM

## 2020-12-12 DIAGNOSIS — E782 Mixed hyperlipidemia: Secondary | ICD-10-CM

## 2020-12-13 LAB — CBC WITH DIFFERENTIAL/PLATELET
Basophils Absolute: 0.1 10*3/uL (ref 0.0–0.2)
Basos: 1 %
EOS (ABSOLUTE): 0.1 10*3/uL (ref 0.0–0.4)
Eos: 1 %
Hematocrit: 40.5 % (ref 34.0–46.6)
Hemoglobin: 12.8 g/dL (ref 11.1–15.9)
Immature Grans (Abs): 0.1 10*3/uL (ref 0.0–0.1)
Immature Granulocytes: 1 %
Lymphocytes Absolute: 2.7 10*3/uL (ref 0.7–3.1)
Lymphs: 27 %
MCH: 27.8 pg (ref 26.6–33.0)
MCHC: 31.6 g/dL (ref 31.5–35.7)
MCV: 88 fL (ref 79–97)
Monocytes Absolute: 0.9 10*3/uL (ref 0.1–0.9)
Monocytes: 9 %
Neutrophils Absolute: 6.1 10*3/uL (ref 1.4–7.0)
Neutrophils: 61 %
Platelets: 324 10*3/uL (ref 150–450)
RBC: 4.6 x10E6/uL (ref 3.77–5.28)
RDW: 13.5 % (ref 11.7–15.4)
WBC: 10 10*3/uL (ref 3.4–10.8)

## 2020-12-13 LAB — LIPID PANEL W/O CHOL/HDL RATIO
Cholesterol, Total: 291 mg/dL — ABNORMAL HIGH (ref 100–199)
HDL: 69 mg/dL (ref >39)
LDL Chol Calc (NIH): 193 mg/dL — ABNORMAL HIGH (ref 0–99)
Triglycerides: 161 mg/dL — ABNORMAL HIGH (ref 0–149)
VLDL Cholesterol Cal: 29 mg/dL (ref 5–40)

## 2020-12-13 LAB — COMPREHENSIVE METABOLIC PANEL
ALT: 51 IU/L — ABNORMAL HIGH (ref 0–32)
AST: 32 IU/L (ref 0–40)
Albumin/Globulin Ratio: 1.7 (ref 1.2–2.2)
Albumin: 5 g/dL — ABNORMAL HIGH (ref 3.8–4.9)
Alkaline Phosphatase: 90 IU/L (ref 44–121)
BUN/Creatinine Ratio: 16 (ref 9–23)
BUN: 12 mg/dL (ref 6–24)
Bilirubin Total: 0.6 mg/dL (ref 0.0–1.2)
CO2: 20 mmol/L (ref 20–29)
Calcium: 9.8 mg/dL (ref 8.7–10.2)
Chloride: 101 mmol/L (ref 96–106)
Creatinine, Ser: 0.75 mg/dL (ref 0.57–1.00)
Globulin, Total: 3 g/dL (ref 1.5–4.5)
Glucose: 92 mg/dL (ref 65–99)
Potassium: 4 mmol/L (ref 3.5–5.2)
Sodium: 139 mmol/L (ref 134–144)
Total Protein: 8 g/dL (ref 6.0–8.5)
eGFR: 93 mL/min/{1.73_m2} (ref >59)

## 2021-01-10 ENCOUNTER — Telehealth: Payer: Self-pay | Admitting: Internal Medicine

## 2021-01-10 DIAGNOSIS — K625 Hemorrhage of anus and rectum: Secondary | ICD-10-CM

## 2021-01-10 NOTE — Telephone Encounter (Signed)
Patient called requesting a referral for a colonoscopy. Patient stated that she notice blood on her stool and is having issues with her hemorrhoids. Patient does not have health insurance nor orange card, and she's aware that she would have to pay out of pocket for that procedure.

## 2021-01-19 NOTE — Telephone Encounter (Signed)
Called patient and left a message asking her to call back.

## 2021-01-21 ENCOUNTER — Other Ambulatory Visit: Payer: Self-pay | Admitting: Internal Medicine

## 2021-01-22 NOTE — Telephone Encounter (Signed)
Please call patient and ask why she is wanting a refill for the cyclobenzaprine last prescribed in September of last year.

## 2021-01-23 NOTE — Telephone Encounter (Signed)
Called patient and left a message asking to call back .

## 2021-01-26 NOTE — Telephone Encounter (Signed)
I called the patient and left a message asking to call back.

## 2021-01-26 NOTE — Telephone Encounter (Signed)
Called patient and left message asking to call back.

## 2021-01-31 NOTE — Telephone Encounter (Signed)
Called patient and left a message asking her to call back.

## 2021-02-20 ENCOUNTER — Ambulatory Visit: Payer: Self-pay | Admitting: Internal Medicine

## 2021-05-08 ENCOUNTER — Other Ambulatory Visit: Payer: Self-pay

## 2021-05-08 ENCOUNTER — Other Ambulatory Visit: Payer: Self-pay | Admitting: Internal Medicine

## 2021-05-08 DIAGNOSIS — Z79899 Other long term (current) drug therapy: Secondary | ICD-10-CM

## 2021-05-08 DIAGNOSIS — E782 Mixed hyperlipidemia: Secondary | ICD-10-CM

## 2021-05-09 LAB — HEPATIC FUNCTION PANEL
ALT: 37 IU/L — ABNORMAL HIGH (ref 0–32)
AST: 23 IU/L (ref 0–40)
Albumin: 4.6 g/dL (ref 3.8–4.9)
Alkaline Phosphatase: 86 IU/L (ref 44–121)
Bilirubin Total: 0.5 mg/dL (ref 0.0–1.2)
Bilirubin, Direct: 0.13 mg/dL (ref 0.00–0.40)
Total Protein: 7.2 g/dL (ref 6.0–8.5)

## 2021-05-09 LAB — LIPID PANEL W/O CHOL/HDL RATIO
Cholesterol, Total: 266 mg/dL — ABNORMAL HIGH (ref 100–199)
HDL: 62 mg/dL (ref >39)
LDL Chol Calc (NIH): 185 mg/dL — ABNORMAL HIGH (ref 0–99)
Triglycerides: 110 mg/dL (ref 0–149)
VLDL Cholesterol Cal: 19 mg/dL (ref 5–40)

## 2021-06-08 ENCOUNTER — Other Ambulatory Visit: Payer: Self-pay | Admitting: Specialist

## 2021-08-20 DIAGNOSIS — H547 Unspecified visual loss: Secondary | ICD-10-CM | POA: Insufficient documentation

## 2021-08-20 DIAGNOSIS — Z9109 Other allergy status, other than to drugs and biological substances: Secondary | ICD-10-CM | POA: Insufficient documentation

## 2021-08-23 ENCOUNTER — Encounter: Payer: Self-pay | Admitting: Internal Medicine

## 2021-09-12 ENCOUNTER — Other Ambulatory Visit: Payer: Self-pay

## 2021-11-03 ENCOUNTER — Other Ambulatory Visit: Payer: Self-pay | Admitting: Internal Medicine

## 2021-11-13 ENCOUNTER — Other Ambulatory Visit: Payer: Self-pay

## 2021-11-13 ENCOUNTER — Ambulatory Visit: Payer: Commercial Managed Care - HMO | Admitting: Internal Medicine

## 2021-11-13 ENCOUNTER — Encounter: Payer: Self-pay | Admitting: Internal Medicine

## 2021-11-13 VITALS — BP 110/86 | HR 60 | Resp 12 | Ht 61.5 in | Wt 185.0 lb

## 2021-11-13 DIAGNOSIS — J302 Other seasonal allergic rhinitis: Secondary | ICD-10-CM

## 2021-11-13 DIAGNOSIS — Z79899 Other long term (current) drug therapy: Secondary | ICD-10-CM

## 2021-11-13 DIAGNOSIS — M25519 Pain in unspecified shoulder: Secondary | ICD-10-CM | POA: Diagnosis not present

## 2021-11-13 DIAGNOSIS — Z0000 Encounter for general adult medical examination without abnormal findings: Secondary | ICD-10-CM

## 2021-11-13 DIAGNOSIS — Z23 Encounter for immunization: Secondary | ICD-10-CM

## 2021-11-13 DIAGNOSIS — Z1231 Encounter for screening mammogram for malignant neoplasm of breast: Secondary | ICD-10-CM

## 2021-11-13 DIAGNOSIS — E782 Mixed hyperlipidemia: Secondary | ICD-10-CM

## 2021-11-13 DIAGNOSIS — H547 Unspecified visual loss: Secondary | ICD-10-CM | POA: Diagnosis not present

## 2021-11-13 DIAGNOSIS — Z9109 Other allergy status, other than to drugs and biological substances: Secondary | ICD-10-CM

## 2021-11-13 MED ORDER — GABAPENTIN 100 MG PO CAPS: 100.0000 mg | ORAL_CAPSULE | Freq: Every day | ORAL | 11 refills | Status: DC

## 2021-11-13 MED ORDER — FLUTICASONE PROPIONATE 50 MCG/ACT NA SUSP: 2.0000 | Freq: Every day | NASAL | 11 refills | Status: DC

## 2021-11-13 MED ORDER — CYCLOBENZAPRINE HCL 10 MG PO TABS: ORAL_TABLET | ORAL | 0 refills | Status: DC

## 2021-11-13 MED ORDER — OLOPATADINE HCL 0.2 % OP SOLN: OPHTHALMIC | 11 refills | Status: AC

## 2021-11-13 MED ORDER — CETIRIZINE HCL 10 MG PO TABS: 10.0000 mg | ORAL_TABLET | Freq: Every day | ORAL | 11 refills | Status: DC

## 2021-11-13 MED ORDER — AMLODIPINE BESYLATE 10 MG PO TABS: 10.0000 mg | ORAL_TABLET | Freq: Every day | ORAL | 3 refills | Status: DC

## 2021-11-13 MED ORDER — FAMOTIDINE 20 MG PO TABS: 40.0000 mg | ORAL_TABLET | Freq: Every day | ORAL | 3 refills | Status: DC

## 2021-11-13 NOTE — Progress Notes (Signed)
Subjective:    Patient ID: Sheryl Martin, female   DOB: Jun 29, 1966, 56 y.o.   MRN: [id number]   HPI  CPE with pap  1.  Pap:  Last 08/2020 and normal.    2.  Mammogram:  None since 2019, normal.  Sister diagnosed with breast cancer likely in her 57s.  She is almost 23 now.    3.  Osteoprevention:  Drinking almond milk twice daily.  Also taking calcium with vitamin D pills, but cannot say how much.  Is on her feet all day at work.  Not physically active outside of work.    4.  Guaiac Cards/FIT:  Guaiac cards last performed in 08/2020 and negative for blood.    5.  Colonoscopy:  Never.  No family history of colon cancer.  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  She will call so can set up for colonoscopy then.    6.  Immunizations:  Has not had COVID bivalent vaccine nor Shingles vaccine.  7.  Glucose/Cholesterol:  Blood glucose has been fine in the past, but cholesterol is high with both high HDL and LDL.  She is fasting today.  Lipid Panel     Component Value Date/Time   CHOL 266 (H) 05/08/2021 0945   TRIG 110 05/08/2021 0945   HDL 62 05/08/2021 0945   LDLCALC 185 (H) 05/08/2021 0945   LABVLDL 19 05/08/2021 0945    Current Meds  Medication Sig   amLODipine (NORVASC) 10 MG tablet Take 1 tablet by mouth once daily   cetirizine (ZYRTEC) 10 MG tablet Take 1 tablet (10 mg total) by mouth daily.   cyclobenzaprine (FLEXERIL) 10 MG tablet TAKE 1/2 (ONE-HALF) TO 1 TABLET  BY MOUTH  TWICE DAILY AS NEEDED FOR MUSCLE SPASM   famotidine (PEPCID) 20 MG tablet TAKE 2 TABLETS BY MOUTH AT BEDTIME   fluticasone (FLONASE) 50 MCG/ACT nasal spray Place 2 sprays into both nostrils daily.   gabapentin (NEURONTIN) 100 MG capsule Take 1 capsule (100 mg total) by mouth at bedtime.   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   Seasonal allergies    Shoulder pain, bilateral 03/28/2017   Sinusitis    Past Surgical History:  Procedure Laterality Date   CHOLECYSTECTOMY  2001   laparoscopic   TUBAL LIGATION  1993   Family History  Problem Relation Age of Onset   Hypertension Mother    Stroke Mother    Cancer Father        lung cancer--smoker   Hypertension Sister    Diabetes Sister    Hypertension Sister        peripheral neuropathy   Peripheral vascular disease Sister    Hypertension Sister    Seizures Sister    Cancer Sister        Breast cancer many years ago   Peripheral vascular disease Sister    Diabetes Sister    Hypertension Sister    Hypertension Sister    Hypertension Sister    Hypertension Sister  Diabetes Sister    Hypertension Brother    Diabetes Brother    Hypertension Brother    Alcohol abuse Brother    Cirrhosis Brother    Hypertension Brother    Alcohol abuse Brother    Hypertension Brother    Seizures Brother    Stroke Brother        cause of death   Stroke Brother    Hypertension Brother    Hypertension Brother    Social History   Socioeconomic History   Marital status: Significant Other    Spouse name: Not on file   Number of children: 2   Years of education: 11   Highest education level: Not on file  Occupational History   Occupation: Pensions consultant at [employer]    Comment: [employer]  Tobacco Use   Smoking status: Never   Smokeless tobacco: Never  Vaping Use   Vaping Use: Never used  Substance and Sexual Activity   Alcohol use: Yes    Comment: rare   Drug use: No   Sexual activity: Yes    Birth control/protection: Post-menopausal, Surgical  Other Topics Concern   Not on file  Social History Narrative   Originally  from [place].   Moved to [facility] [place]   Son is still in college and playing football at some level (not in college, however)   Son lives with her part time.   Daughter and her family live nearby.   Social Determinants of Health   Financial Resource Strain: Low Risk    Difficulty of Paying Living Expenses: Not hard at all  Food Insecurity: No Food Insecurity   Worried About Charity fundraiser in the Last Year: Never true   [place] in the Last Year: Never true  Transportation Needs: No Transportation Needs   Lack of Transportation (Medical): No   Lack of Transportation (Non-Medical): No  Physical Activity: Not on file  Stress: Not on file  Social Connections: Not on file  Intimate Partner Violence: Not At Risk   Fear of Current or Ex-Partner: No   Emotionally Abused: No   Physically Abused: No   Sexually Abused: No      Review of Systems  HENT:  Positive for rhinorrhea (clear.), sinus pain (burning in sinuses.  Eyes itchy and dry feeling.) and sneezing (Admits using her allergy medicine only as needed.  Discussed Flonase does not work that way, unless just a problem during certain part of year--she has year round.).   Eyes:  Positive for redness, itching and visual disturbance (Diffulty reading now--has reading glasses.).  Respiratory:  Negative for shortness of breath (Denies issue with asthma).   Cardiovascular:  Negative for chest pain, palpitations and leg swelling.  Gastrointestinal:  Negative for blood in stool.  Musculoskeletal:        Upper back/shoulders/neck stiffness and pain.  No headaches.  No history of sudden visual loss.   Also with stiffness in pelvic girdle area and low back.    Psychiatric/Behavioral:  Positive for sleep disturbance (Night sweats and stiffness in shoulders keeps her up.  Keeps bedroom cool.  Fine if takes muscle relaxant.).      Objective:   BP 110/86 (BP Location: Left Arm, Patient Position: Sitting, Cuff Size: Normal)     Pulse 60    Resp 12    Ht 5' 1.5" (1.562 m)    Wt 185 lb (83.9 kg)    LMP 12/14/2006 (Approximate)    BMI 34.39 kg/m  Physical Exam Constitutional:      Appearance: She is obese.  HENT:     Head: Normocephalic and atraumatic.     Comments: Temporal arteries compressible with good pulses bilaterally.    Right Ear: Tympanic membrane, ear canal and external ear normal.     Left Ear: Tympanic membrane, ear canal and external ear normal.     Nose: Congestion present.     Mouth/Throat:     Mouth: Mucous membranes are moist.     Pharynx: Oropharynx is clear.  Eyes:     Extraocular Movements: Extraocular movements intact.     Conjunctiva/sclera: Conjunctivae normal.     Pupils: Pupils are equal, round, and reactive to light.     Funduscopic exam:    Right eye: Red reflex present.        Left eye: Red reflex present.    Comments: Discs sharp bilaterally.  Neck:     Thyroid: No thyroid mass or thyromegaly.  Cardiovascular:     Rate and Rhythm: Normal rate and regular rhythm.     Heart sounds: S1 normal and S2 normal. No murmur heard.   No friction rub. No S3 or S4 sounds.     Comments: No carotid bruits.  Carotid, radial, femoral, DP and PT pulses normal and equal.    Pulmonary:     Effort: Pulmonary effort is normal.     Breath sounds: Normal breath sounds.  Chest:  Breasts:    Right: No inverted nipple, mass, nipple discharge or skin change.     Left: No inverted nipple, mass, nipple discharge or skin change.  Abdominal:     General: Bowel sounds are normal.     Palpations: Abdomen is soft. There is no hepatomegaly, splenomegaly or mass.     Tenderness: There is no abdominal tenderness.     Hernia: No hernia is present.  Genitourinary:    Comments: Normal external female genitalia No vaginal discharge. No uterine or adnexal mass or tenderness. Musculoskeletal:        General: Normal range of motion.     Cervical back: Normal range of motion and neck supple.     Right lower  leg: No edema.     Left lower leg: No edema.     Comments: Some tenderness across traps bilaterally.  Neck is quite short and with poor posture with neck held in posterior extension when sitting.  Lymphadenopathy:     Head:     Right side of head: No submental or submandibular adenopathy.     Left side of head: No submental or submandibular adenopathy.     Cervical: No cervical adenopathy.     Upper Body:     Right upper body: No supraclavicular or axillary adenopathy.     Left upper body: No supraclavicular or axillary adenopathy.     Lower Body: No right inguinal adenopathy. No left inguinal adenopathy.  Skin:    General: Skin is warm.     Capillary Refill: Capillary refill takes less than 2 seconds.     Findings: No rash.  Neurological:     General: No focal deficit present.     Mental Status: She is alert and oriented to person, place, and time.     Cranial Nerves: Cranial nerves 2-12 are intact.     Sensory: Sensation is intact.     Motor: Motor function is intact.     Coordination: Coordination is intact.     Gait: Gait is intact.  Deep Tendon Reflexes: Reflexes are normal and symmetric.  Psychiatric:        Attention and Perception: Attention normal.        Mood and Affect: Mood normal.        Speech: Speech normal.        Behavior: Behavior normal. Behavior is cooperative.     Assessment & Plan    CPE without pap Mammogram ordered FIT to return in 2 weeks. CBC, CMP, FLP Moderna COVID bivalent booster Shingrix #1/2  2.  Hypertension:  controlled  3.  Hypercholesterolemia:  Total and LDL too high in July.  Rechecking today.  4.  Possible shoulder and pelvic girdle stiffness and definite.  Nothing on exam to really support something like polymyalgia rheumatica, but checking ESR, ANA, RF.  CBC as well.    5.  Allergies:  as she seems to have allergies more so throughout the year, encouraged her to use nasal fluticasone regularly and not just here and there when  symptoms are worse.

## 2021-11-13 NOTE — Patient Instructions (Signed)
[facility] [place], [place] [address]  [phone] Hours of Operation Mondays to Thursdays: 8 am to 8 pm Fridays: 9 am to 8 pm Saturdays: 9 am to 1 pm Sundays: Closed   See about joining and getting set up with water exercise or walk laps in pool and set weekly goals to gradually increase.  Would like you to consider 3 times weekly or more.

## 2021-11-14 LAB — LIPID PANEL W/O CHOL/HDL RATIO
Cholesterol, Total: 285 mg/dL — ABNORMAL HIGH (ref 100–199)
HDL: 66 mg/dL (ref >39)
LDL Chol Calc (NIH): 198 mg/dL — ABNORMAL HIGH (ref 0–99)
Triglycerides: 121 mg/dL (ref 0–149)
VLDL Cholesterol Cal: 21 mg/dL (ref 5–40)

## 2021-11-14 LAB — CBC WITH DIFFERENTIAL/PLATELET
Basophils Absolute: 0.1 10*3/uL (ref 0.0–0.2)
Basos: 1 %
EOS (ABSOLUTE): 0.1 10*3/uL (ref 0.0–0.4)
Eos: 1 %
Hematocrit: 39 % (ref 34.0–46.6)
Hemoglobin: 12.8 g/dL (ref 11.1–15.9)
Immature Grans (Abs): 0 10*3/uL (ref 0.0–0.1)
Immature Granulocytes: 0 %
Lymphocytes Absolute: 3 10*3/uL (ref 0.7–3.1)
Lymphs: 29 %
MCH: 28.8 pg (ref 26.6–33.0)
MCHC: 32.8 g/dL (ref 31.5–35.7)
MCV: 88 fL (ref 79–97)
Monocytes Absolute: 0.8 10*3/uL (ref 0.1–0.9)
Monocytes: 8 %
Neutrophils Absolute: 6.2 10*3/uL (ref 1.4–7.0)
Neutrophils: 61 %
Platelets: 302 10*3/uL (ref 150–450)
RBC: 4.44 x10E6/uL (ref 3.77–5.28)
RDW: 13.4 % (ref 11.7–15.4)
WBC: 10.3 10*3/uL (ref 3.4–10.8)

## 2021-11-14 LAB — COMPREHENSIVE METABOLIC PANEL
ALT: 36 IU/L — ABNORMAL HIGH (ref 0–32)
AST: 24 IU/L (ref 0–40)
Albumin/Globulin Ratio: 1.5 (ref 1.2–2.2)
Albumin: 4.7 g/dL (ref 3.8–4.9)
Alkaline Phosphatase: 97 IU/L (ref 44–121)
BUN/Creatinine Ratio: 15 (ref 9–23)
BUN: 12 mg/dL (ref 6–24)
Bilirubin Total: 0.7 mg/dL (ref 0.0–1.2)
CO2: 25 mmol/L (ref 20–29)
Calcium: 9.8 mg/dL (ref 8.7–10.2)
Chloride: 106 mmol/L (ref 96–106)
Creatinine, Ser: 0.81 mg/dL (ref 0.57–1.00)
Globulin, Total: 3.1 g/dL (ref 1.5–4.5)
Glucose: 82 mg/dL (ref 70–99)
Potassium: 4 mmol/L (ref 3.5–5.2)
Sodium: 145 mmol/L — ABNORMAL HIGH (ref 134–144)
Total Protein: 7.8 g/dL (ref 6.0–8.5)
eGFR: 85 mL/min/{1.73_m2} (ref >59)

## 2021-11-14 LAB — SEDIMENTATION RATE: Sed Rate: 17 mm/hr (ref 0–40)

## 2021-11-14 LAB — ANA: Anti Nuclear Antibody (ANA): NEGATIVE

## 2021-11-14 LAB — RHEUMATOID FACTOR: Rheumatoid fact SerPl-aCnc: <10 IU/mL (ref <14.0)

## 2021-11-14 LAB — TSH: TSH: 1.59 u[IU]/mL (ref 0.450–4.500)

## 2021-11-20 ENCOUNTER — Other Ambulatory Visit: Payer: Self-pay

## 2021-11-20 ENCOUNTER — Other Ambulatory Visit (INDEPENDENT_AMBULATORY_CARE_PROVIDER_SITE_OTHER): Payer: Commercial Managed Care - HMO

## 2021-11-20 DIAGNOSIS — Z1211 Encounter for screening for malignant neoplasm of colon: Secondary | ICD-10-CM

## 2021-11-20 LAB — POC FIT TEST STOOL: Fecal Occult Blood: NEGATIVE

## 2021-12-04 MED ORDER — SIMVASTATIN 40 MG PO TABS: ORAL_TABLET | ORAL | 11 refills | Status: DC

## 2021-12-04 NOTE — Addendum Note (Signed)
Addended by: Marcelino Duster on: 12/04/2021 10:22 AM   Modules accepted: Orders

## 2021-12-25 ENCOUNTER — Other Ambulatory Visit: Payer: Self-pay

## 2021-12-25 DIAGNOSIS — Z1231 Encounter for screening mammogram for malignant neoplasm of breast: Secondary | ICD-10-CM

## 2022-01-04 ENCOUNTER — Inpatient Hospital Stay: Admission: RE | Admit: 2022-01-04 | Payer: Self-pay | Source: Ambulatory Visit

## 2022-01-11 ENCOUNTER — Ambulatory Visit
Admission: RE | Admit: 2022-01-11 | Discharge: 2022-01-11 | Disposition: A | Discharge status: Home / Self Care | Payer: No Typology Code available for payment source | Source: Ambulatory Visit | Attending: Internal Medicine | Admitting: Internal Medicine

## 2022-01-11 DIAGNOSIS — Z1231 Encounter for screening mammogram for malignant neoplasm of breast: Secondary | ICD-10-CM

## 2022-01-15 ENCOUNTER — Other Ambulatory Visit: Payer: Commercial Managed Care - HMO

## 2022-01-15 DIAGNOSIS — E782 Mixed hyperlipidemia: Secondary | ICD-10-CM | POA: Diagnosis not present

## 2022-01-16 LAB — HEPATIC FUNCTION PANEL
ALT: 37 IU/L — ABNORMAL HIGH (ref 0–32)
AST: 21 IU/L (ref 0–40)
Albumin: 4.4 g/dL (ref 3.8–4.9)
Alkaline Phosphatase: 90 IU/L (ref 44–121)
Bilirubin Total: 0.6 mg/dL (ref 0.0–1.2)
Bilirubin, Direct: 0.17 mg/dL (ref 0.00–0.40)
Total Protein: 7.2 g/dL (ref 6.0–8.5)

## 2022-01-16 LAB — LIPID PANEL W/O CHOL/HDL RATIO
Cholesterol, Total: 169 mg/dL (ref 100–199)
HDL: 70 mg/dL (ref >39)
LDL Chol Calc (NIH): 85 mg/dL (ref 0–99)
Triglycerides: 74 mg/dL (ref 0–149)
VLDL Cholesterol Cal: 14 mg/dL (ref 5–40)

## 2022-02-02 ENCOUNTER — Telehealth: Payer: Self-pay

## 2022-02-02 NOTE — Telephone Encounter (Signed)
Patient would like referral to [facility] specialist because her shoulder pain never went away. Patient describes that pain has gotten worse and has a feeling of stiffness. Patient is taking gabapentin and cyclobenzaprine, but it does not help any. Pain is constant.    ?

## 2022-02-15 NOTE — Telephone Encounter (Signed)
Attempted to call to offer appointment, but was unable to reach her ?

## 2022-02-21 NOTE — Telephone Encounter (Signed)
Attempted to call and offer an appointment. Unable to reach patient ?

## 2022-03-06 ENCOUNTER — Other Ambulatory Visit: Payer: Self-pay | Admitting: Specialist

## 2022-04-13 ENCOUNTER — Other Ambulatory Visit: Payer: Self-pay | Admitting: Specialist

## 2022-05-14 ENCOUNTER — Ambulatory Visit: Payer: Commercial Managed Care - HMO | Admitting: Internal Medicine

## 2022-05-20 ENCOUNTER — Other Ambulatory Visit: Payer: Self-pay

## 2022-05-20 ENCOUNTER — Encounter (HOSPITAL_COMMUNITY): Payer: Self-pay | Admitting: *Deleted

## 2022-05-20 ENCOUNTER — Ambulatory Visit (HOSPITAL_COMMUNITY)
Admission: EM | Admit: 2022-05-20 | Discharge: 2022-05-20 | Disposition: A | Discharge status: Home / Self Care | Payer: Commercial Managed Care - HMO | Attending: Internal Medicine | Admitting: Internal Medicine

## 2022-05-20 DIAGNOSIS — M25561 Pain in right knee: Secondary | ICD-10-CM

## 2022-05-20 DIAGNOSIS — S8391XA Sprain of unspecified site of right knee, initial encounter: Secondary | ICD-10-CM | POA: Diagnosis not present

## 2022-05-20 MED ORDER — IBUPROFEN 800 MG PO TABS: 800.0000 mg | ORAL_TABLET | Freq: Three times a day (TID) | ORAL | 0 refills | Status: DC

## 2022-05-20 MED ORDER — KETOROLAC TROMETHAMINE 30 MG/ML IJ SOLN
INTRAMUSCULAR | Status: AC
  Filled 2022-05-20: qty 1

## 2022-05-20 MED ORDER — KETOROLAC TROMETHAMINE 30 MG/ML IJ SOLN
30.0000 mg | Freq: Once | INTRAMUSCULAR | Status: AC
  Administered 2022-05-20: 30 mg via INTRAMUSCULAR

## 2022-05-20 NOTE — ED Provider Notes (Signed)
[facility]    CSN: [id number] Arrival date & time: 05/20/22  1008      History   Chief Complaint Chief Complaint  Patient presents with   Joint Swelling    HPI Sheryl Martin is a 56 y.o. female.   Patient presents to urgent care for evaluation of pain to the right knee that is worse with walking over the last 2-3 weeks. Pain and swelling worsened on Friday May 18, 2022 (2 days ago).  Patient applied some ice to the area and elevated the knee with some relief of the swelling and inflammation.  Patient works long hours standing while wearing steel toed shoes.  Denies recent falls, injuries, or trauma to the knee.  No redness or warmth to the knee.  Patient denies history of gout and arthritis.  Patient takes muscle relaxers for her shoulder pain and has attempted use of ibuprofen over-the-counter without much relief of her pain.  Denies feeling any clicks or pops to the knee but states that she sometimes feels like her knee "locks up".  Knee pain is worse with extension than with flexion.  Last dose of over-the-counter pain medicine was 3 days ago on Thursday, May 17, 2022.  No other aggravating or relieving factors identified for patient's symptoms at this time.     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 Authorizing Provider  ibuprofen (ADVIL) 800 MG tablet Take 1 tablet (800 mg total) by mouth 3 (three) times daily. 05/20/22  Yes Talbot Grumbling, FNP  amLODipine (NORVASC) 10 MG tablet Take 1 tablet (10 mg total) by mouth daily. 11/13/21   Mack Hook, MD  cetirizine (ZYRTEC) 10 MG tablet Take 1 tablet (10 mg total) by mouth daily. 11/13/21   Mack Hook, MD  cyclobenzaprine (FLEXERIL) 10 MG tablet TAKE 1/2 (ONE-HALF) TO 1 TABLET  BY MOUTH  TWICE DAILY AS NEEDED FOR MUSCLE SPASM 11/13/21   Mack Hook, MD  famotidine (PEPCID) 20 MG tablet Take 2 tablets (40 mg total) by mouth at bedtime. 11/13/21   Mack Hook, MD  fluticasone (FLONASE) 50 MCG/ACT nasal spray Place 2 sprays into both nostrils daily. 11/13/21   Mulberry,  Benjamine Mola, MD  gabapentin (NEURONTIN) 100 MG capsule Take 1 capsule (100 mg total) by mouth at bedtime. 11/13/21   Mack Hook, MD  Olopatadine HCl 0.2 % SOLN 1 drop each eye daily as needed for allergies 11/13/21   Mack Hook, MD  simvastatin (ZOCOR) 40 MG tablet 1 tab by mouth daily with evening meal 12/04/21   Mack Hook, MD    Family History Family History  Problem Relation Age of Onset   Hypertension Mother    Stroke Mother    Cancer Father        lung cancer--smoker   Hypertension Sister    Diabetes Sister    Hypertension Sister        peripheral neuropathy   Peripheral vascular disease Sister    Hypertension Sister    Seizures Sister    Breast cancer Sister    Cancer Sister        Breast cancer many years ago   Peripheral vascular disease Sister    Diabetes Sister    Hypertension Sister    Hypertension  Sister    Hypertension Sister    Hypertension Sister    Diabetes Sister    Hypertension Brother    Diabetes Brother    Hypertension Brother    Alcohol abuse Brother    Cirrhosis Brother    Hypertension Brother    Alcohol abuse Brother    Hypertension Brother    Seizures Brother    Stroke Brother        cause of death   Stroke Brother    Hypertension Brother    Hypertension Brother     Social History Social History   Tobacco Use   Smoking status: Never   Smokeless tobacco: Never  Vaping Use   Vaping Use: Never used  Substance Use Topics   Alcohol use: Yes    Comment: rare   Drug use: No     Allergies   Robaxin [methocarbamol]   Review of Systems Review of Systems   Physical Exam Triage Vital Signs ED Triage Vitals  Enc Vitals Group     BP 05/20/22 1043 (!) 147/89     Pulse Rate 05/20/22 1043 62     Resp 05/20/22 1043 20     Temp 05/20/22 1043 97.8 F (36.6 C)     Temp src --      SpO2 05/20/22 1043 98 %     Weight --      Height --      Head Circumference --      Peak Flow --      Pain Score 05/20/22 1040 8     Pain Loc --      Pain Edu? --      Excl. in [place]? --    No data found.  Updated Vital Signs BP (!) 147/89   Pulse 62   Temp 97.8 F (36.6 C)   Resp 20   LMP 12/14/2006 (Approximate)   SpO2 98%   Visual Acuity Right Eye Distance:   Left Eye Distance:   Bilateral Distance:    Right Eye Near:   Left Eye Near:    Bilateral Near:     Physical Exam Vitals and nursing note reviewed.  Constitutional:      Appearance: Normal appearance. She is not ill-appearing or toxic-appearing.     Comments: Very pleasant patient sitting on exam in position of comfort table in no acute distress.   HENT:     Head: Normocephalic and  atraumatic.     Right Ear: Hearing and external ear normal.     Left Ear: Hearing and external ear normal.     Nose: Nose normal.     Mouth/Throat:     Lips: Pink.     Mouth: Mucous membranes are moist.  Eyes:      General: Lids are normal. Vision grossly intact. Gaze aligned appropriately.     Extraocular Movements: Extraocular movements intact.     Conjunctiva/sclera: Conjunctivae normal.  Pulmonary:     Effort: Pulmonary effort is normal.  Abdominal:     Palpations: Abdomen is soft.  Musculoskeletal:     Cervical back: Neck supple.     Right knee: Bony tenderness present. Normal range of motion.     Left knee: Normal.     Comments: Tender to palpation of the inferior patella of the right knee.  No laxity elicited with knee exam.  No evidence of injury, trauma, or bruising to the right knee.  No warmth or erythema to the right knee.  There is mild swelling present to the anterior/inferior patella. +1 popliteal pulses bilaterally.  5/5 strength against resistance with flexion and extension of the right knee.  Sensation is intact distal to injury/pain.  Patient has full range of motion that is not limited by tenderness to the right knee joint with flexion and extension.  Skin:    General: Skin is warm and dry.     Capillary Refill: Capillary refill takes less than 2 seconds.     Findings: No rash.  Neurological:     General: No focal deficit present.     Mental Status: She is alert and oriented to person, place, and time. Mental status is at baseline.     Cranial Nerves: No dysarthria or facial asymmetry.     Gait: Gait is intact.  Psychiatric:        Mood and Affect: Mood normal.        Speech: Speech normal.        Behavior: Behavior normal.        Thought Content: Thought content normal.        Judgment: Judgment normal.       UC Treatments / Results  Labs (all labs ordered are listed, but only abnormal results are displayed) Labs Reviewed - No data to display  EKG   Radiology No results found.  Procedures Procedures (including critical care time)  Medications Ordered in UC Medications  ketorolac (TORADOL) 30 MG/ML injection 30 mg (30 mg Intramuscular Given 05/20/22 1129)     Initial Impression / Assessment and Plan / UC Course  I have reviewed the triage vital signs and the nursing notes.  Pertinent labs & imaging results that were available during my care of the patient were reviewed by me and considered in my medical decision making (see chart for details).  1.  Acute pain of right knee Symptoms and physical exam are consistent with acute sprain of the right knee.  No clinical indication for imaging as patient is able to ambulate with steady gait without limp and has full range of motion/strength to the right knee.  Low suspicion for acute bony abnormality due to lack of injury/trauma to the knee as well.  Symptoms are likely related to overuse.  30 mg ketorolac injection given in the clinic for pain management and inflammation.  She may continue taking 800 mg of ibuprofen every 8 hours as needed for pain and inflammation at home with food.  Knee brace applied in clinic to be worn to provide compression, stability, and comfort to the right knee.  Rest, ice, elevation, and compression advised.  Work note given.  Walking referral to orthopedics given and patient advised to follow-up with orthopedic provider in the next 1 to 2 weeks if symptoms or not improving.  Discussed physical exam and available lab work findings in clinic with patient.  Counseled patient regarding appropriate use of medications and potential side effects for all medications recommended or prescribed today. Discussed red flag signs and symptoms of worsening condition,when to call the PCP office, return to urgent care, and when to seek higher level of care in the emergency department. Patient verbalizes understanding and agreement with plan. All questions answered. Patient discharged in stable condition.  Final Clinical Impressions(s) / UC Diagnoses   Final diagnoses:  Sprain of right knee, unspecified ligament, initial encounter  Acute pain of right knee     Discharge Instructions       Wear the knee brace we provided in the clinic for the next couple of weeks to provide compression, stability, and comfort.  Please rest, ice, and elevate your right knee to help it heal and decrease inflammation.   Take '800mg'$  ibuprofen every 8 hours or tylenol 1,000 every 6 hours as needed for pain.   Your next dose of ibuprofen may be at 8pm tonight due to ketorolac injection in clinc.  Your next dose of tylenol may be at home.  Call the orthopedic provider listed on your discharge paperwork to schedule a follow-up appointment if your symptoms do not improve in the next 1-2 weeks with supportive care.  Return to urgent care if you experience worsening pain, numbness, tingling, change of color in your skin near the injury, or any other concerning symptoms.  I hope you feel better!   ED Prescriptions     Medication Sig Dispense Auth. Provider   ibuprofen (ADVIL) 800 MG tablet Take 1 tablet (800 mg total) by mouth 3 (three) times daily. 21 tablet Talbot Grumbling, FNP      PDMP not reviewed this encounter.   Talbot Grumbling, [place] 05/23/22 2148

## 2022-05-20 NOTE — ED Triage Notes (Signed)
Pt reports Rt knee swelling for 2-3 weeks.

## 2022-05-20 NOTE — Discharge Instructions (Signed)
Wear the knee brace we provided in the clinic for the next couple of weeks to provide compression, stability, and comfort.  Please rest, ice, and elevate your right knee to help it heal and decrease inflammation.   Take '800mg'$  ibuprofen every 8 hours or tylenol 1,000 every 6 hours as needed for pain.   Your next dose of ibuprofen may be at 8pm tonight due to ketorolac injection in clinc.  Your next dose of tylenol may be at home.  Call the orthopedic provider listed on your discharge paperwork to schedule a follow-up appointment if your symptoms do not improve in the next 1-2 weeks with supportive care.  Return to urgent care if you experience worsening pain, numbness, tingling, change of color in your skin near the injury, or any other concerning symptoms.  I hope you feel better!

## 2022-07-11 ENCOUNTER — Encounter: Payer: Self-pay | Admitting: Internal Medicine

## 2022-07-11 ENCOUNTER — Ambulatory Visit (INDEPENDENT_AMBULATORY_CARE_PROVIDER_SITE_OTHER): Payer: Commercial Managed Care - HMO | Admitting: Internal Medicine

## 2022-07-11 VITALS — BP 128/80 | HR 68 | Resp 12 | Ht 61.5 in | Wt 189.5 lb

## 2022-07-11 DIAGNOSIS — M4802 Spinal stenosis, cervical region: Secondary | ICD-10-CM

## 2022-07-11 DIAGNOSIS — H538 Other visual disturbances: Secondary | ICD-10-CM

## 2022-07-11 DIAGNOSIS — M5412 Radiculopathy, cervical region: Secondary | ICD-10-CM

## 2022-07-11 MED ORDER — LORATADINE 10 MG PO TABS: 10.0000 mg | ORAL_TABLET | Freq: Every day | ORAL | 11 refills | Status: AC
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
## 2022-07-29 ENCOUNTER — Ambulatory Visit (INDEPENDENT_AMBULATORY_CARE_PROVIDER_SITE_OTHER): Payer: Commercial Managed Care - HMO

## 2022-07-29 ENCOUNTER — Encounter (HOSPITAL_COMMUNITY): Payer: Self-pay | Admitting: Emergency Medicine

## 2022-07-29 ENCOUNTER — Ambulatory Visit (HOSPITAL_COMMUNITY)
Admission: EM | Admit: 2022-07-29 | Discharge: 2022-07-29 | Disposition: A | Discharge status: Home / Self Care | Payer: Commercial Managed Care - HMO | Attending: Physician Assistant | Admitting: Physician Assistant

## 2022-07-29 DIAGNOSIS — J209 Acute bronchitis, unspecified: Secondary | ICD-10-CM

## 2022-07-29 DIAGNOSIS — R059 Cough, unspecified: Secondary | ICD-10-CM | POA: Diagnosis not present

## 2022-07-29 DIAGNOSIS — R0989 Other specified symptoms and signs involving the circulatory and respiratory systems: Secondary | ICD-10-CM | POA: Diagnosis not present

## 2022-07-29 MED ORDER — PREDNISONE 20 MG PO TABS: 40.0000 mg | ORAL_TABLET | Freq: Every day | ORAL | 0 refills | Status: AC

## 2022-07-29 NOTE — ED Triage Notes (Signed)
Pt reports a cough x 2 weeks and chest congestion x 1 week. States she took benadryl for symptoms.

## 2022-07-29 NOTE — Discharge Instructions (Signed)
  Chest xray was clear- suspect bronchitis as discussed.   Prednisone prescribed to hopefully help with chest congestion/ cough.   Please follow up if no gradual improvement or with any further concerns.

## 2022-07-29 NOTE — ED Provider Notes (Signed)
[facility]    CSN: [id number] Arrival date & time: 07/29/22  1055      History   Chief Complaint Chief Complaint  Patient presents with   Cough   Chest Congestion    HPI Sheryl Martin is a 56 y.o. female.   Patient here today for evaluation of cough she has had the last 2 weeks. She reports she has had more chest congestion over the last week as well. She has tried taking benadryl without significant improvement of cough. She denies fever.   The history is provided by the patient.    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 Authorizing Provider  predniSONE (DELTASONE) 20 MG tablet Take 2 tablets (40 mg total) by mouth daily with breakfast for 5 days. 07/29/22 08/03/22 Yes Francene Finders, PA-C  amLODipine (NORVASC) 10 MG tablet Take 1 tablet (10 mg total) by mouth daily. 11/13/21   Mack Hook, MD  cyclobenzaprine (FLEXERIL) 10 MG tablet TAKE 1/2 (ONE-HALF) TO 1 TABLET  BY MOUTH  TWICE DAILY AS  NEEDED FOR MUSCLE SPASM 11/13/21   Mack Hook, MD  famotidine (PEPCID) 20 MG tablet Take 2 tablets (40 mg total) by mouth at bedtime. 11/13/21   Mack Hook, MD  fluticasone (FLONASE) 50 MCG/ACT nasal spray Place 2 sprays into both nostrils daily. 11/13/21   Mack Hook, MD  gabapentin (NEURONTIN) 100 MG capsule Take 1 capsule (100 mg total) by mouth at bedtime. 11/13/21   Mack Hook, MD  ibuprofen (ADVIL) 800 MG tablet Take 1 tablet (800 mg total) by mouth 3 (three) times daily. Patient not taking: Reported on 07/11/2022 05/20/22   Talbot Grumbling, FNP  loratadine (CLARITIN) 10 MG tablet Take 1 tablet (10 mg total) by mouth daily. 07/11/22   Mack Hook, MD  Olopatadine HCl 0.2 % SOLN 1 drop each eye daily as needed for allergies 11/13/21   Mack Hook, MD  simvastatin (ZOCOR) 40 MG tablet 1 tab by mouth daily with evening meal 12/04/21   Mack Hook, MD    Family History Family History  Problem Relation Age of Onset   Hypertension Mother    Stroke Mother  Cancer Father        lung cancer--smoker   Hypertension Sister    Diabetes Sister    Hypertension Sister        peripheral neuropathy   Peripheral vascular disease Sister    Hypertension Sister    Seizures Sister    Breast cancer Sister    Cancer Sister        Breast cancer many years ago   Peripheral vascular disease Sister    Diabetes Sister    Hypertension Sister    Hypertension Sister    Hypertension Sister    Hypertension Sister    Diabetes Sister    Hypertension Brother    Diabetes Brother    Hypertension Brother    Alcohol abuse Brother    Cirrhosis Brother    Hypertension Brother    Alcohol abuse Brother    Hypertension Brother    Seizures Brother    Stroke Brother        cause of death   Stroke Brother    Hypertension Brother    Hypertension Brother     Social History Social History   Tobacco Use   Smoking status: Never   Smokeless tobacco:  Never  Vaping Use   Vaping Use: Never used  Substance Use Topics   Alcohol use: Yes    Comment: rare   Drug use: No     Allergies   Robaxin [methocarbamol]   Review of Systems Review of Systems  Constitutional:  Negative for chills and fever.  HENT:  Negative for congestion, ear pain, sinus pressure and sore throat.   Eyes:  Negative for discharge and redness.  Respiratory:  Positive for cough. Negative for shortness of breath and wheezing.   Gastrointestinal:  Negative for abdominal pain, diarrhea, nausea and vomiting.     Physical Exam Triage Vital Signs ED Triage Vitals  Enc Vitals Group     BP      Pulse      Resp      Temp      Temp src      SpO2      Weight      Height      Head Circumference      Peak Flow      Pain Score      Pain Loc      Pain Edu?      Excl. in [place]?    No data found.  Updated Vital Signs BP 136/87 (BP Location: Right Arm)   Pulse 67   Temp 97.8 F (36.6 C) (Oral)   Resp 16   LMP 12/14/2006 (Approximate)   SpO2 97%      Physical Exam Vitals and nursing note reviewed.  Constitutional:      General: She is not in acute distress.    Appearance: Normal appearance. She is not ill-appearing.  HENT:     Head: Normocephalic and atraumatic.     Nose: Congestion present.     Mouth/Throat:     Mouth: Mucous membranes are moist.     Pharynx: No oropharyngeal exudate or posterior oropharyngeal erythema.  Eyes:     Conjunctiva/sclera: Conjunctivae normal.  Cardiovascular:     Rate and Rhythm: Normal rate and regular rhythm.     Heart sounds: Normal heart sounds. No murmur heard. Pulmonary:     Effort: Pulmonary effort is normal. No respiratory distress.     Breath sounds: Normal breath sounds. No wheezing, rhonchi or rales.  Skin:  General: Skin is warm and dry.  Neurological:     Mental Status: She is alert.  Psychiatric:        Mood and Affect: Mood normal.        Thought Content: Thought content normal.      UC  Treatments / Results  Labs (all labs ordered are listed, but only abnormal results are displayed) Labs Reviewed - No data to display  EKG   Radiology DG Chest 2 View  Result Date: 07/29/2022 CLINICAL DATA:  2 week history of cough and chest congestion. EXAM: CHEST - 2 VIEW COMPARISON:  05/10/2015 FINDINGS: The lungs are clear without focal pneumonia, edema, pneumothorax or pleural effusion. The cardiopericardial silhouette is within normal limits for size. The visualized bony structures of the thorax are unremarkable. IMPRESSION: No active cardiopulmonary disease. Electronically Signed   By: Misty Stanley M.D.   On: 07/29/2022 12:51    Procedures Procedures (including critical care time)  Medications Ordered in UC Medications - No data to display  Initial Impression / Assessment and Plan / UC Course  I have reviewed the triage vital signs and the nursing notes.  Pertinent labs & imaging results that were available during my care of the patient were reviewed by me and considered in my medical decision making (see chart for details).    CXR ordered for further evaluation. Discussed most likely bronchitis- will treat with steroid burst and recommended follow up with any further concerns.   Final Clinical Impressions(s) / UC Diagnoses   Final diagnoses:  Acute bronchitis, unspecified organism     Discharge Instructions       Chest xray was clear- suspect bronchitis as discussed.   Prednisone prescribed to hopefully help with chest congestion/ cough.   Please follow up if no gradual improvement or with any further concerns.      ED Prescriptions     Medication Sig Dispense Auth. Provider   predniSONE (DELTASONE) 20 MG tablet Take 2 tablets (40 mg total) by mouth daily with breakfast for 5 days. 10 tablet Francene Finders, PA-C      PDMP not reviewed this encounter.   Francene Finders, PA-C 07/29/22 1352

## 2022-08-01 ENCOUNTER — Ambulatory Visit: Payer: Commercial Managed Care - HMO | Admitting: Specialist

## 2022-08-03 ENCOUNTER — Ambulatory Visit: Payer: Commercial Managed Care - HMO | Admitting: Specialist

## 2022-08-23 ENCOUNTER — Ambulatory Visit: Payer: Commercial Managed Care - HMO | Admitting: Orthopedic Surgery

## 2022-09-29 ENCOUNTER — Encounter (HOSPITAL_COMMUNITY): Payer: Self-pay

## 2022-09-29 ENCOUNTER — Ambulatory Visit (INDEPENDENT_AMBULATORY_CARE_PROVIDER_SITE_OTHER): Payer: Commercial Managed Care - HMO

## 2022-09-29 ENCOUNTER — Ambulatory Visit (HOSPITAL_COMMUNITY)
Admission: EM | Admit: 2022-09-29 | Discharge: 2022-09-29 | Disposition: A | Discharge status: Home / Self Care | Payer: Commercial Managed Care - HMO | Attending: Physician Assistant | Admitting: Physician Assistant

## 2022-09-29 DIAGNOSIS — R059 Cough, unspecified: Secondary | ICD-10-CM | POA: Diagnosis not present

## 2022-09-29 DIAGNOSIS — R051 Acute cough: Secondary | ICD-10-CM

## 2022-09-29 MED ORDER — PREDNISONE 20 MG PO TABS: 40.0000 mg | ORAL_TABLET | Freq: Every day | ORAL | 0 refills | Status: AC

## 2022-09-29 MED ORDER — BENZONATATE 100 MG PO CAPS: 100.0000 mg | ORAL_CAPSULE | Freq: Three times a day (TID) | ORAL | 0 refills | Status: DC

## 2022-09-29 MED ORDER — PROMETHAZINE-DM 6.25-15 MG/5ML PO SYRP: 5.0000 mL | ORAL_SOLUTION | Freq: Four times a day (QID) | ORAL | 0 refills | Status: DC | PRN

## 2022-09-29 NOTE — Discharge Instructions (Addendum)
Your chest xray is normal today Continue with nasal spray and allergy medication.  Use cough syrup as needed Can use tessalon pearls as needed for cough Take prednisone as prescribed daily for 5 days Return if you develop new or worsening symptoms.

## 2022-09-29 NOTE — ED Triage Notes (Signed)
Onset 1-2 weeks of cough and chest congestion. Pt has taken OTC medication with no relief.

## 2022-09-29 NOTE — ED Provider Notes (Signed)
[place]    CSN: [id number] Arrival date & time: 09/29/22  1001      History   Chief Complaint Chief Complaint  Patient presents with   Cough   Nasal Congestion    HPI Cosandra Plouffe is a 56 y.o. female.   Pt complains of a dry cough that started about two weeks ago.  Reports she is experiencing mild congestion.  Cough does get worse at night.  She reports feeling flushed and experiencing chills.  She denies wheezing, shortness of breath, n/v/d.  She has chronic upper back and neck pain and reports feeling more achy than baseline.  She has been using nasal spray with some relief of congestion.     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 Authorizing Provider  benzonatate (TESSALON) 100 MG capsule Take 1 capsule (100 mg total) by mouth every 8 (eight) hours. 09/29/22  Yes Ward, Lenise Arena, PA-C  predniSONE (DELTASONE) 20 MG tablet Take 2 tablets (40 mg total) by mouth daily for 5 days. 09/29/22 10/04/22  Yes Ward, Lenise Arena, PA-C  promethazine-dextromethorphan (PROMETHAZINE-DM) 6.25-15 MG/5ML syrup Take 5 mLs by mouth 4 (four) times daily as needed for cough. 09/29/22  Yes Ward, Lenise Arena, PA-C  amLODipine (NORVASC) 10 MG tablet Take 1 tablet (10 mg total) by mouth daily. 11/13/21   Mack Hook, MD  cyclobenzaprine (FLEXERIL) 10 MG tablet TAKE 1/2 (ONE-HALF) TO 1 TABLET  BY MOUTH  TWICE DAILY AS NEEDED FOR MUSCLE SPASM 11/13/21   Mack Hook, MD  famotidine (PEPCID) 20 MG tablet Take 2 tablets (40 mg total) by mouth at bedtime. 11/13/21   Mack Hook, MD  fluticasone (FLONASE) 50 MCG/ACT nasal spray Place 2 sprays into both nostrils daily. 11/13/21   Mack Hook, MD  gabapentin (NEURONTIN) 100 MG capsule Take 1 capsule (100 mg total) by mouth at bedtime. 11/13/21   Mack Hook, MD  ibuprofen (ADVIL) 800 MG tablet Take 1 tablet (800 mg total) by mouth 3 (three) times daily. Patient not taking: Reported on 07/11/2022 05/20/22   Talbot Grumbling, FNP  loratadine (CLARITIN) 10 MG tablet Take 1 tablet (10 mg total) by mouth daily. 07/11/22  Mack Hook, MD  Olopatadine HCl 0.2 % SOLN 1 drop each eye daily as needed for allergies 11/13/21   Mack Hook, MD  simvastatin (ZOCOR) 40 MG tablet 1 tab by mouth daily with evening meal 12/04/21   Mack Hook, MD    Family History Family History  Problem Relation Age of Onset   Hypertension Mother    Stroke Mother    Cancer Father        lung cancer--smoker   Hypertension Sister    Diabetes Sister    Hypertension Sister        peripheral neuropathy   Peripheral vascular disease Sister    Hypertension Sister    Seizures Sister    Breast cancer Sister    Cancer Sister        Breast cancer many years ago   Peripheral vascular disease Sister    Diabetes Sister    Hypertension Sister    Hypertension Sister    Hypertension Sister    Hypertension Sister    Diabetes Sister    Hypertension  Brother    Diabetes Brother    Hypertension Brother    Alcohol abuse Brother    Cirrhosis Brother    Hypertension Brother    Alcohol abuse Brother    Hypertension Brother    Seizures Brother    Stroke Brother        cause of death   Stroke Brother    Hypertension Brother    Hypertension Brother     Social History Social History   Tobacco Use   Smoking status: Never   Smokeless tobacco: Never  Vaping Use   Vaping Use: Never used  Substance Use Topics   Alcohol use: Yes    Comment: rare   Drug use: No     Allergies   Robaxin [methocarbamol]   Review of Systems Review of Systems  Constitutional:  Negative for chills and fever.  HENT:  Positive for congestion. Negative for ear pain and sore throat.   Eyes:  Negative for pain and visual disturbance.  Respiratory:  Positive for cough. Negative for shortness of breath.   Cardiovascular:  Negative for chest pain and palpitations.  Gastrointestinal:  Negative for abdominal pain and vomiting.  Genitourinary:  Negative for dysuria and hematuria.  Musculoskeletal:  Negative for arthralgias and back pain.  Skin:  Negative for color change and rash.  Neurological:  Negative for seizures and syncope.  All other systems reviewed and are negative.    Physical Exam Triage Vital Signs ED Triage Vitals  Enc Vitals Group     BP      Pulse      Resp      Temp      Temp src      SpO2      Weight      Height      Head Circumference      Peak Flow      Pain Score      Pain Loc      Pain Edu?      Excl. in [place]?    No data found.  Updated Vital Signs BP (!) 138/95 (BP Location: Left Arm)   Pulse 82   Temp 98.3 F (36.8 C) (Oral)   Resp 16   LMP 12/14/2006 (Approximate)   SpO2 98%   Visual Acuity Right Eye Distance:   Left Eye Distance:   Bilateral Distance:    Right Eye Near:   Left Eye Near:  Bilateral Near:     Physical Exam Vitals and nursing note reviewed.  Constitutional:      General: She is  not in acute distress.    Appearance: She is well-developed.  HENT:     Head: Normocephalic and atraumatic.  Eyes:     Conjunctiva/sclera: Conjunctivae normal.  Cardiovascular:     Rate and Rhythm: Normal rate and regular rhythm.     Heart sounds: No murmur heard. Pulmonary:     Effort: Pulmonary effort is normal. No respiratory distress.     Breath sounds: Normal breath sounds.     Comments: LLL with decreased breath sounds Abdominal:     Palpations: Abdomen is soft.     Tenderness: There is no abdominal tenderness.  Musculoskeletal:        General: No swelling.     Cervical back: Neck supple.  Skin:    General: Skin is warm and dry.     Capillary Refill: Capillary refill takes less than 2 seconds.  Neurological:     Mental Status: She is alert.  Psychiatric:        Mood and Affect: Mood normal.      UC Treatments / Results  Labs (all labs ordered are listed, but only abnormal results are displayed) Labs Reviewed - No data to display  EKG   Radiology DG Chest 2 View  Result Date: 09/29/2022 CLINICAL DATA:  Cough EXAM: CHEST - 2 VIEW COMPARISON:  07/29/2022 FINDINGS: The heart size and mediastinal contours are within normal limits. Both lungs are clear. The visualized skeletal structures are unremarkable. IMPRESSION: No active cardiopulmonary disease. Electronically Signed   By: Davina Poke D.O.   On: 09/29/2022 11:10    Procedures Procedures (including critical care time)  Medications Ordered in UC Medications - No data to display  Initial Impression / Assessment and Plan / UC Course  I have reviewed the triage vital signs and the nursing notes.  Pertinent labs & imaging results that were available during my care of the patient were reviewed by me and considered in my medical decision making (see chart for details).   Will get chest xray given decreased breath sounds LLL and pt reports sweats/feeling flushed.  No lower extremity edema noted.   Chest xray  negative.  Will send in supportive medications.  Pt stable for discharge.  Return precautions discussed.  Final Clinical Impressions(s) / UC Diagnoses   Final diagnoses:  Acute cough     Discharge Instructions      Your chest xray is normal today Continue with nasal spray and allergy medication.  Use cough syrup as needed Can use tessalon pearls as needed for cough Take prednisone as prescribed daily for 5 days Return if you develop new or worsening symptoms.       ED Prescriptions     Medication Sig Dispense Auth. Provider   benzonatate (TESSALON) 100 MG capsule Take 1 capsule (100 mg total) by mouth every 8 (eight) hours. 21 capsule Ward, Lenise Arena, PA-C   promethazine-dextromethorphan (PROMETHAZINE-DM) 6.25-15 MG/5ML syrup Take 5 mLs by mouth 4 (four) times daily as needed for cough. 118 mL Ward, Janett Billow Z, PA-C   predniSONE (DELTASONE) 20 MG tablet Take 2 tablets (40 mg total) by mouth daily for 5 days. 10 tablet Ward, Lenise Arena, PA-C      PDMP not reviewed this encounter.   Ward, Lenise Arena, PA-C 09/29/22 1115

## 2022-10-15 IMAGING — MG MM DIGITAL SCREENING BILAT W/ TOMO AND CAD
6 of 12 series · 6 of 36 positions shown · non-contrast
Comparison: Previous exam(s).

CLINICAL DATA: Screening.

EXAM:
DIGITAL SCREENING BILATERAL MAMMOGRAM WITH TOMOSYNTHESIS AND CAD
TECHNIQUE: Bilateral screening digital craniocaudal and mediolateral oblique
mammograms were obtained. Bilateral screening digital breast
tomosynthesis was performed. The images were evaluated with
computer-aided detection.

[R MLO synth-2D (1 of 2)]
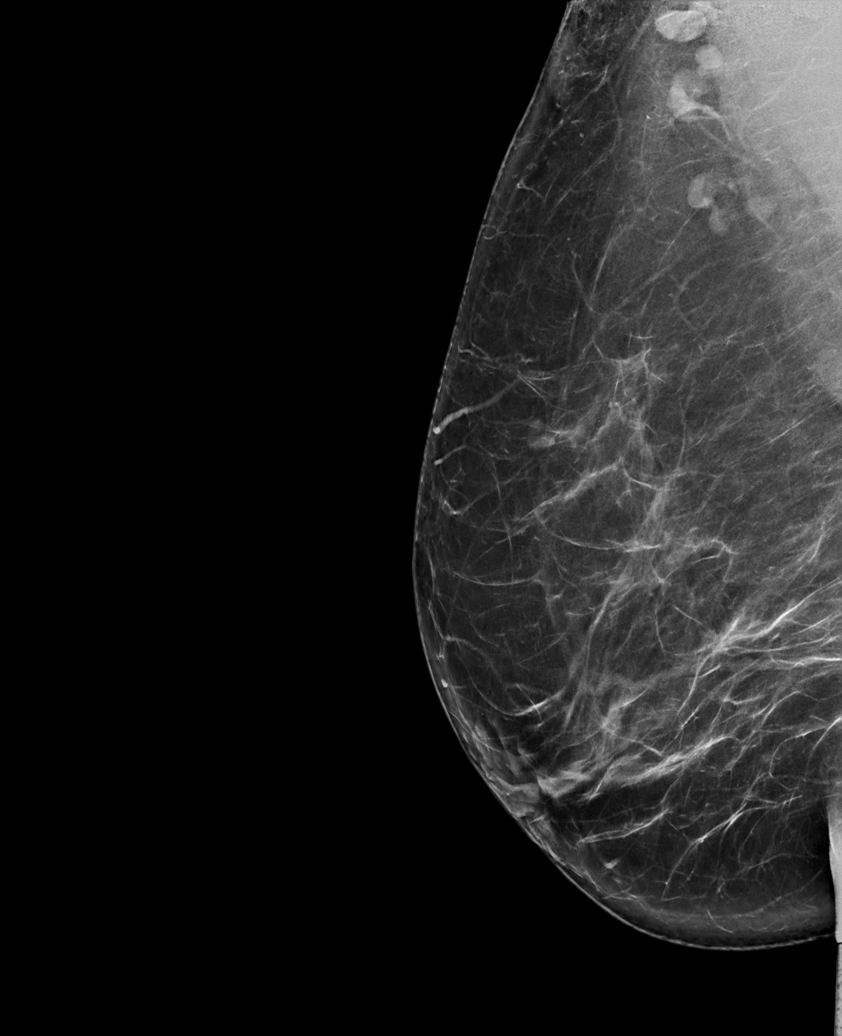

[R CC synth-2D]
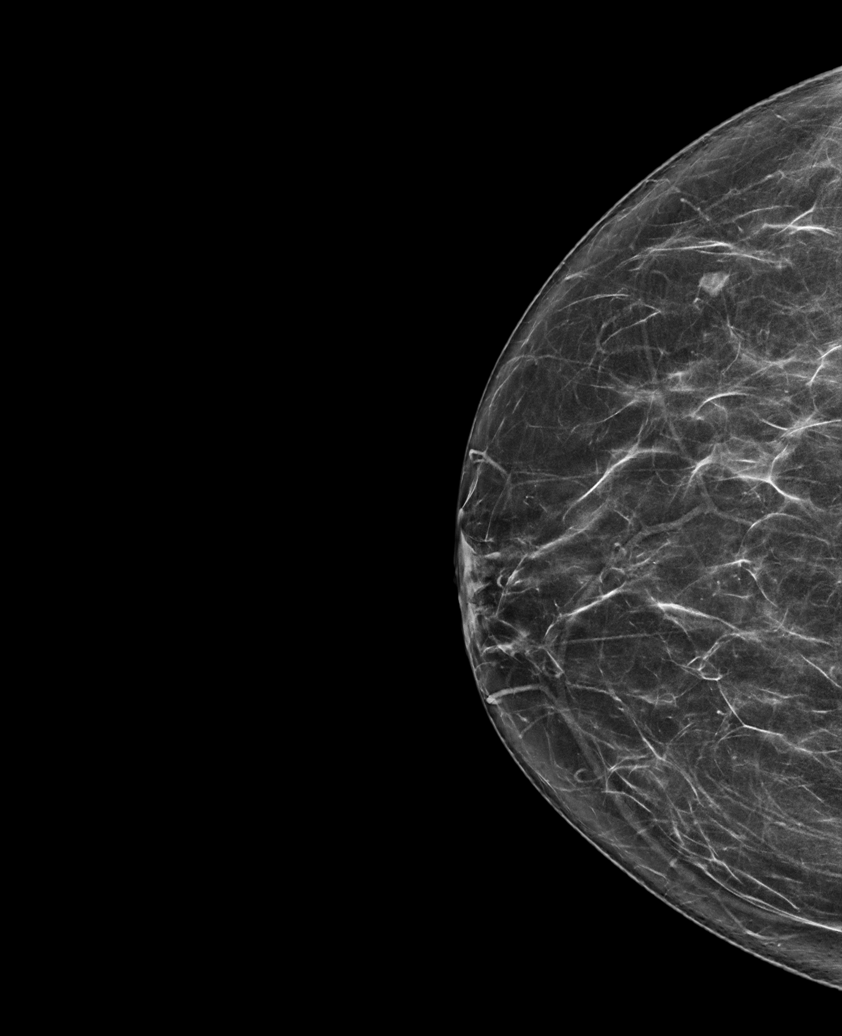

[R MLO synth-2D (2 of 2)]
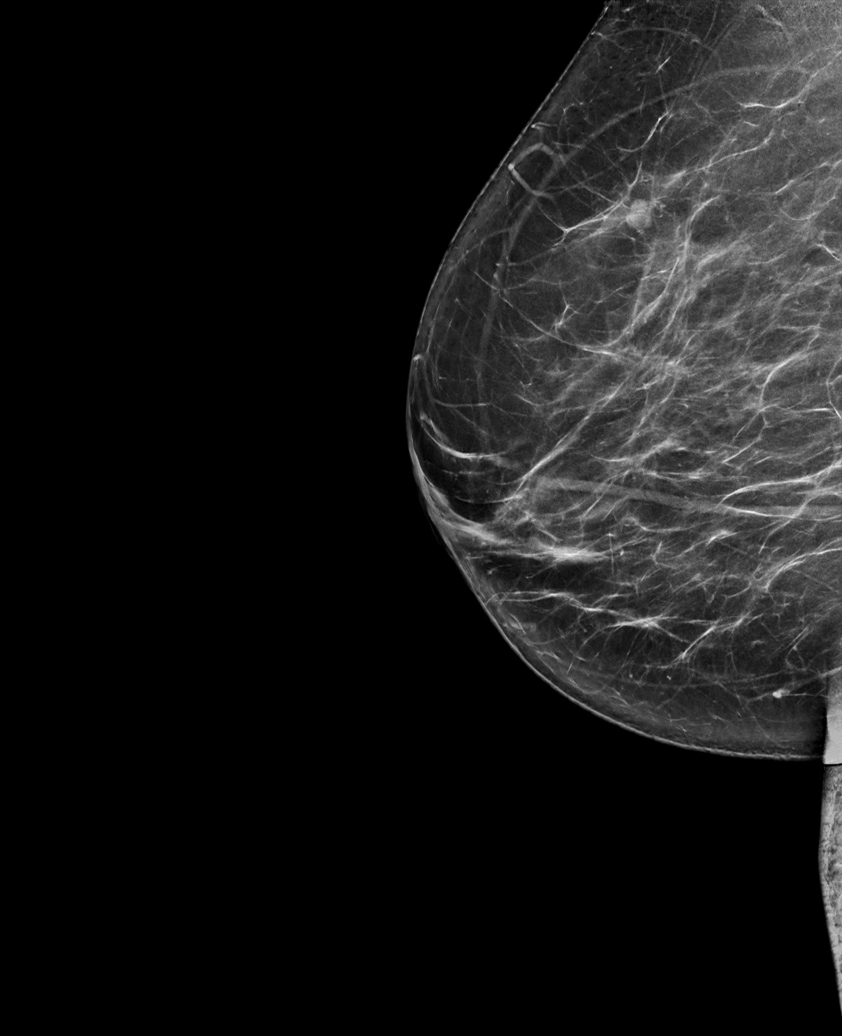

[L CC synth-2D]
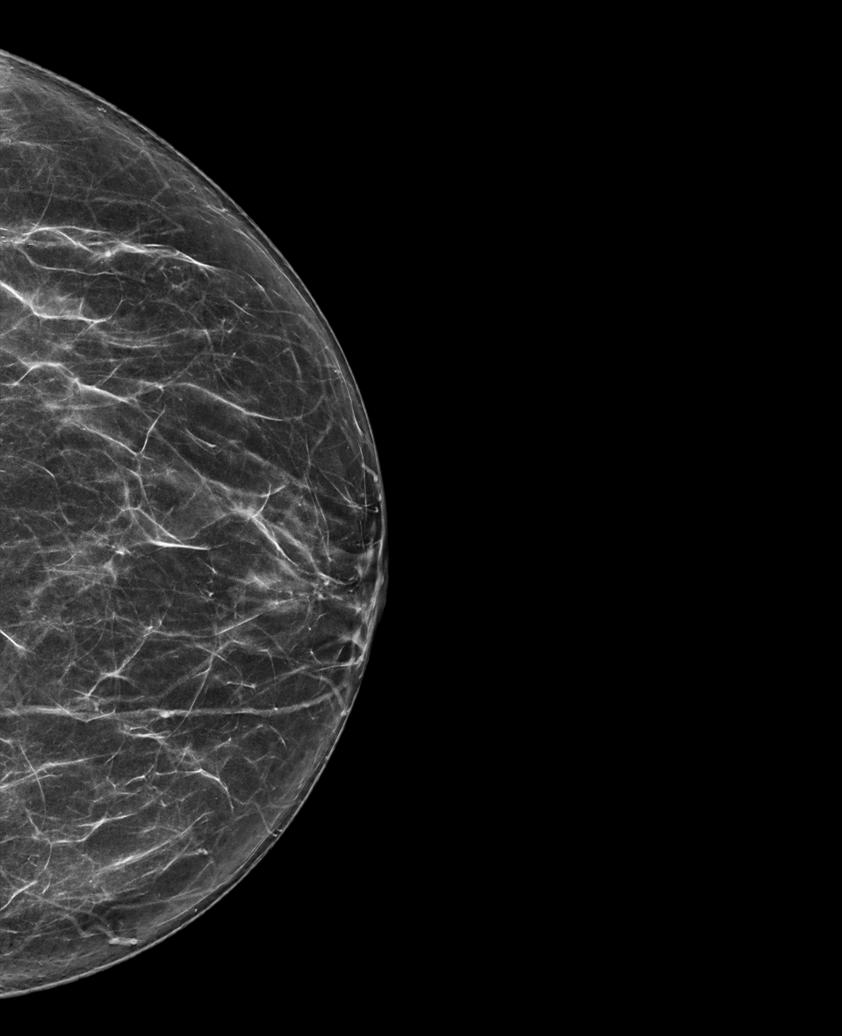

[L MLO synth-2D (1 of 2)]
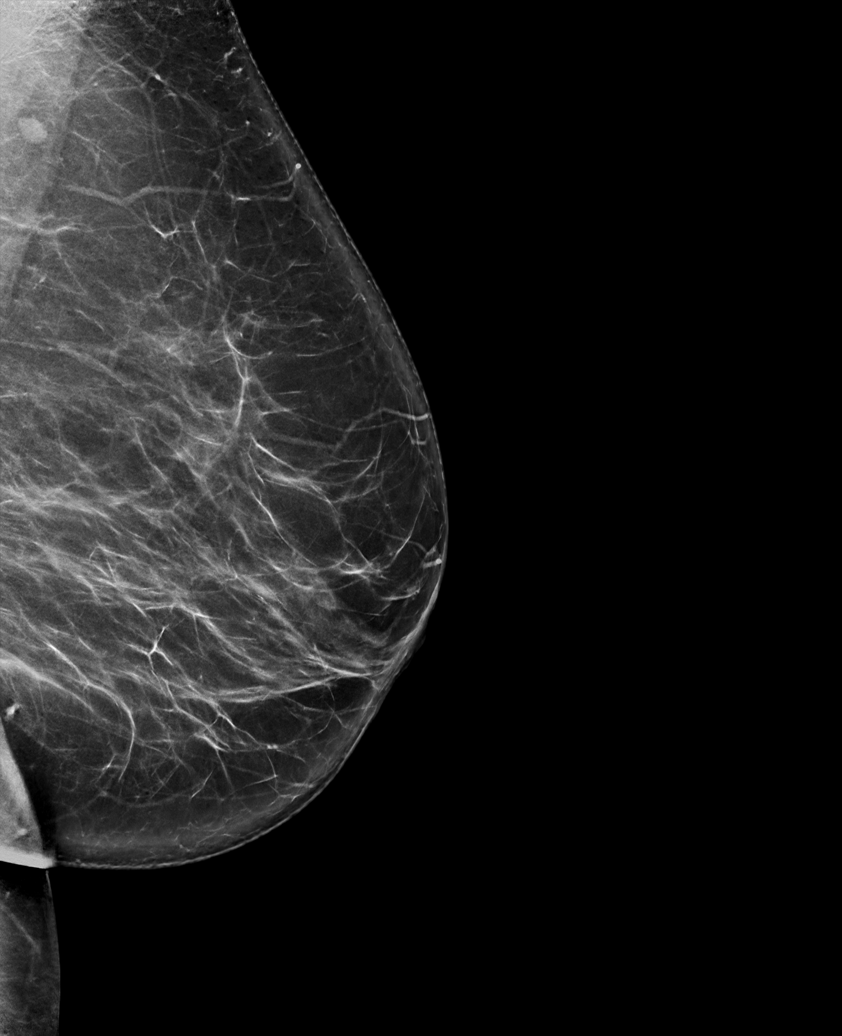

[L MLO synth-2D (2 of 2)]
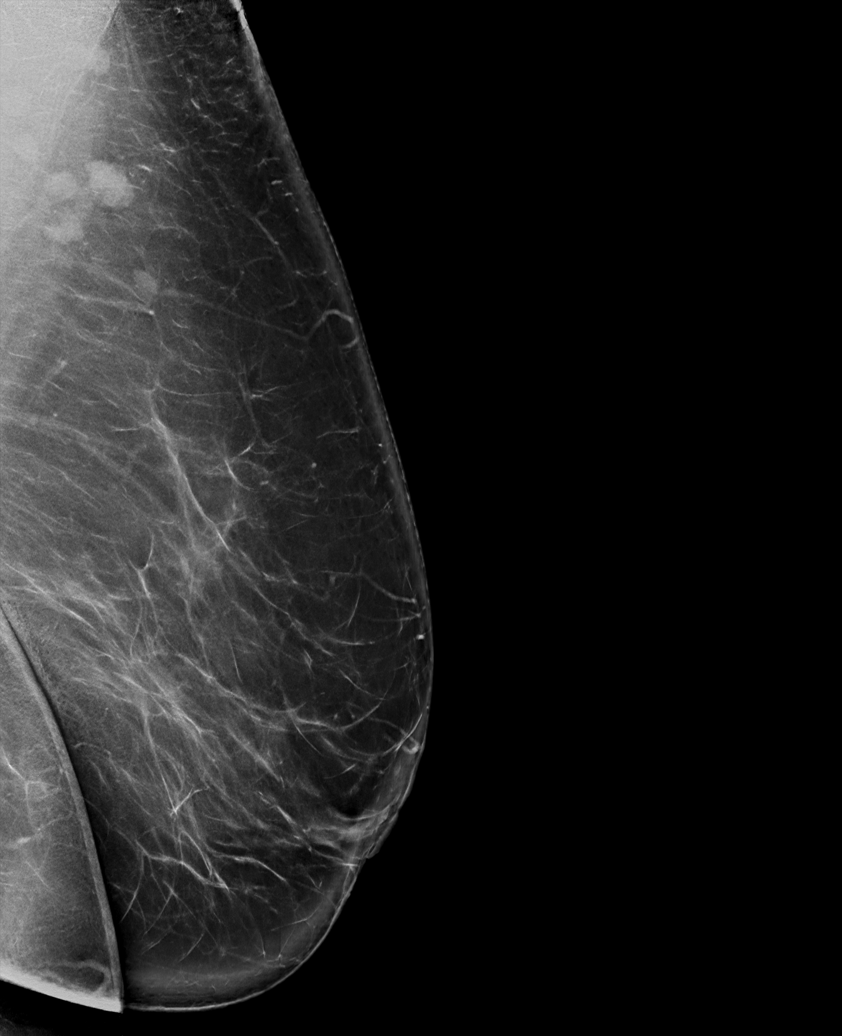

[6 of 36 positions shown; findings below may reference images not displayed]

ACR Breast Density Category b: There are scattered areas of
fibroglandular density.
FINDINGS: There are no findings suspicious for malignancy.
IMPRESSION: No mammographic evidence of malignancy. A result letter of this
screening mammogram will be mailed directly to the patient.

RECOMMENDATION:
Screening mammogram in one year. (Code:51-O-LD2)

BI-RADS CATEGORY  1: Negative.

## 2022-11-13 ENCOUNTER — Other Ambulatory Visit (INDEPENDENT_AMBULATORY_CARE_PROVIDER_SITE_OTHER): Payer: Commercial Managed Care - HMO

## 2022-11-13 DIAGNOSIS — Z79899 Other long term (current) drug therapy: Secondary | ICD-10-CM

## 2022-11-13 DIAGNOSIS — E782 Mixed hyperlipidemia: Secondary | ICD-10-CM | POA: Diagnosis not present

## 2022-11-14 LAB — COMPREHENSIVE METABOLIC PANEL
ALT: 32 IU/L (ref 0–32)
AST: 25 IU/L (ref 0–40)
Albumin/Globulin Ratio: 2 (ref 1.2–2.2)
Albumin: 4.7 g/dL (ref 3.8–4.9)
Alkaline Phosphatase: 74 IU/L (ref 44–121)
BUN/Creatinine Ratio: 15 (ref 9–23)
BUN: 10 mg/dL (ref 6–24)
Bilirubin Total: 0.8 mg/dL (ref 0.0–1.2)
CO2: 23 mmol/L (ref 20–29)
Calcium: 9.2 mg/dL (ref 8.7–10.2)
Chloride: 102 mmol/L (ref 96–106)
Creatinine, Ser: 0.66 mg/dL (ref 0.57–1.00)
Globulin, Total: 2.3 g/dL (ref 1.5–4.5)
Glucose: 97 mg/dL (ref 70–99)
Potassium: 4.1 mmol/L (ref 3.5–5.2)
Sodium: 140 mmol/L (ref 134–144)
Total Protein: 7 g/dL (ref 6.0–8.5)
eGFR: 102 mL/min/{1.73_m2} (ref >59)

## 2022-11-14 LAB — LIPID PANEL W/O CHOL/HDL RATIO
Cholesterol, Total: 188 mg/dL (ref 100–199)
HDL: 66 mg/dL (ref >39)
LDL Chol Calc (NIH): 108 mg/dL — ABNORMAL HIGH (ref 0–99)
Triglycerides: 76 mg/dL (ref 0–149)
VLDL Cholesterol Cal: 14 mg/dL (ref 5–40)

## 2022-11-14 LAB — CBC WITH DIFFERENTIAL/PLATELET
Basophils Absolute: 0.1 10*3/uL (ref 0.0–0.2)
Basos: 1 %
EOS (ABSOLUTE): 0.1 10*3/uL (ref 0.0–0.4)
Eos: 1 %
Hematocrit: 36.3 % (ref 34.0–46.6)
Hemoglobin: 11.8 g/dL (ref 11.1–15.9)
Immature Grans (Abs): 0 10*3/uL (ref 0.0–0.1)
Immature Granulocytes: 0 %
Lymphocytes Absolute: 2.9 10*3/uL (ref 0.7–3.1)
Lymphs: 30 %
MCH: 28.7 pg (ref 26.6–33.0)
MCHC: 32.5 g/dL (ref 31.5–35.7)
MCV: 88 fL (ref 79–97)
Monocytes Absolute: 0.8 10*3/uL (ref 0.1–0.9)
Monocytes: 9 %
Neutrophils Absolute: 5.6 10*3/uL (ref 1.4–7.0)
Neutrophils: 59 %
Platelets: 294 10*3/uL (ref 150–450)
RBC: 4.11 x10E6/uL (ref 3.77–5.28)
RDW: 13.7 % (ref 11.7–15.4)
WBC: 9.6 10*3/uL (ref 3.4–10.8)

## 2022-11-19 ENCOUNTER — Encounter: Payer: Self-pay | Admitting: Internal Medicine

## 2022-11-19 ENCOUNTER — Encounter: Payer: Self-pay | Admitting: Gastroenterology

## 2022-11-19 ENCOUNTER — Ambulatory Visit (INDEPENDENT_AMBULATORY_CARE_PROVIDER_SITE_OTHER): Payer: Commercial Managed Care - HMO | Admitting: Internal Medicine

## 2022-11-19 VITALS — BP 114/80 | HR 84 | Resp 16 | Ht 61.0 in | Wt 183.0 lb

## 2022-11-19 DIAGNOSIS — Z1211 Encounter for screening for malignant neoplasm of colon: Secondary | ICD-10-CM

## 2022-11-19 DIAGNOSIS — Z780 Asymptomatic menopausal state: Secondary | ICD-10-CM

## 2022-11-19 DIAGNOSIS — Z1231 Encounter for screening mammogram for malignant neoplasm of breast: Secondary | ICD-10-CM | POA: Diagnosis not present

## 2022-11-19 DIAGNOSIS — G8929 Other chronic pain: Secondary | ICD-10-CM

## 2022-11-19 DIAGNOSIS — Z0000 Encounter for general adult medical examination without abnormal findings: Secondary | ICD-10-CM | POA: Diagnosis not present

## 2022-11-19 DIAGNOSIS — H547 Unspecified visual loss: Secondary | ICD-10-CM

## 2022-11-19 DIAGNOSIS — M25511 Pain in right shoulder: Secondary | ICD-10-CM

## 2022-11-19 DIAGNOSIS — M25512 Pain in left shoulder: Secondary | ICD-10-CM

## 2022-11-19 MED ORDER — CALCIUM CITRATE 250 MG PO TABS: ORAL_TABLET | ORAL | 0 refills | Status: AC
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
## 2022-11-21 ENCOUNTER — Ambulatory Visit (AMBULATORY_SURGERY_CENTER): Payer: Commercial Managed Care - HMO | Admitting: *Deleted

## 2022-11-21 ENCOUNTER — Telehealth: Payer: Self-pay | Admitting: Gastroenterology

## 2022-11-21 VITALS — Ht 61.0 in | Wt 183.0 lb

## 2022-11-21 DIAGNOSIS — Z1211 Encounter for screening for malignant neoplasm of colon: Secondary | ICD-10-CM

## 2022-11-21 MED ORDER — NA SULFATE-K SULFATE-MG SULF 17.5-3.13-1.6 GM/177ML PO SOLN: 1.0000 | Freq: Once | ORAL | 0 refills | Status: AC

## 2022-11-21 NOTE — Progress Notes (Signed)
Pre visit completed over telephone.  Instructions forwarded through [facility] and mailed.    No egg or soy allergy known to patient  No issues known to pt with past sedation with any surgeries or procedures Patient denies ever being told they had issues or difficulty with intubation  No FH of Malignant Hyperthermia Pt is not on diet pills Pt is not on  home 02  Pt is not on blood thinners  Pt denies issues with constipation  No A fib or A flutter Have any cardiac testing pending-  NO Pt instructed to use [email or web address] or GoodRx for a price reduction on prep

## 2022-11-21 NOTE — Telephone Encounter (Signed)
Inbound call from patients pharmacy requesting a call back to discuss prep medication that was sent for patient. Please advise.

## 2022-11-21 NOTE — Telephone Encounter (Signed)
Unable to reach pharmacy,  No answer called multiple times [phone]

## 2022-11-22 MED ORDER — NA SULFATE-K SULFATE-MG SULF 17.5-3.13-1.6 GM/177ML PO SOLN: 1.0000 | Freq: Once | ORAL | 0 refills | Status: AC

## 2022-11-22 NOTE — Telephone Encounter (Signed)
Spoke with pharmacy- needs Suprep resent.  Done

## 2022-12-04 ENCOUNTER — Encounter: Payer: Self-pay | Admitting: Gastroenterology

## 2022-12-13 ENCOUNTER — Other Ambulatory Visit: Payer: Self-pay | Admitting: Internal Medicine

## 2022-12-13 DIAGNOSIS — E782 Mixed hyperlipidemia: Secondary | ICD-10-CM

## 2022-12-17 ENCOUNTER — Ambulatory Visit (AMBULATORY_SURGERY_CENTER): Payer: Commercial Managed Care - HMO | Admitting: Gastroenterology

## 2022-12-17 ENCOUNTER — Encounter: Payer: Self-pay | Admitting: Gastroenterology

## 2022-12-17 VITALS — BP 122/72 | HR 52 | Temp 97.3°F | Resp 10 | Ht 61.0 in | Wt 183.0 lb

## 2022-12-17 DIAGNOSIS — Z1211 Encounter for screening for malignant neoplasm of colon: Secondary | ICD-10-CM | POA: Diagnosis present

## 2022-12-17 DIAGNOSIS — D122 Benign neoplasm of ascending colon: Secondary | ICD-10-CM

## 2022-12-17 MED ORDER — SODIUM CHLORIDE 0.9 % IV SOLN: 500.0000 mL | Freq: Once | INTRAVENOUS | Status: DC

## 2022-12-17 NOTE — Patient Instructions (Signed)
Thank you for coming in to see [place] today! Resume your diet and medications/supplements today. Return to regular daily activities tomorrow. Future colonoscopy will be recommended, year to be determined once biopsy results of today's visit are final.   YOU HAD AN ENDOSCOPIC PROCEDURE TODAY AT [facility]:   Refer to the procedure report that was given to you for any specific questions about what was found during the examination.  If the procedure report does not answer your questions, please call your gastroenterologist to clarify.  If you requested that your care partner not be given the details of your procedure findings, then the procedure report has been included in a sealed envelope for you to review at your convenience later.  YOU SHOULD EXPECT: Some feelings of bloating in the abdomen. Passage of more gas than usual.  Walking can help get rid of the air that was put into your GI tract during the procedure and reduce the bloating. If you had a lower endoscopy (such as a colonoscopy or flexible sigmoidoscopy) you may notice spotting of blood in your stool or on the toilet paper. If you underwent a bowel prep for your procedure, you may not have a normal bowel movement for a few days.  Please Note:  You might notice some irritation and congestion in your nose or some drainage.  This is from the oxygen used during your procedure.  There is no need for concern and it should clear up in a day or so.  SYMPTOMS TO REPORT IMMEDIATELY:  Following lower endoscopy (colonoscopy or flexible sigmoidoscopy):  Excessive amounts of blood in the stool  Significant tenderness or worsening of abdominal pains  Swelling of the abdomen that is new, acute  Fever of 100F or higher    For urgent or emergent issues, a gastroenterologist can be reached at any hour by calling [phone]. Do not use MyChart messaging for urgent concerns.    DIET:  We do recommend a small meal at first, but then  you may proceed to your regular diet.  Drink plenty of fluids but you should avoid alcoholic beverages for 24 hours.  ACTIVITY:  You should plan to take it easy for the rest of today and you should NOT DRIVE or use heavy machinery until tomorrow (because of the sedation medicines used during the test).    FOLLOW UP: Our staff will call the number listed on your records the next business day following your procedure.  We will call around 7:15- 8:00 am to check on you and address any questions or concerns that you may have regarding the information given to you following your procedure. If we do not reach you, we will leave a message.     If any biopsies were taken you will be contacted by phone or by letter within the next 1-3 weeks.  Please call us at [phone] if you have not heard about the biopsies in 3 weeks.    SIGNATURES/CONFIDENTIALITY: You and/or your care partner have signed paperwork which will be entered into your electronic medical record.  These signatures attest to the fact that that the information above on your After Visit Summary has been reviewed and is understood.  Full responsibility of the confidentiality of this discharge information lies with you and/or your care-partner.

## 2022-12-17 NOTE — Progress Notes (Signed)
VS completed by DT.  Pt's states no medical or surgical changes since previsit or office visit.  

## 2022-12-17 NOTE — Op Note (Signed)
[facility] Patient Name: Sheryl Martin Procedure Date: 12/17/2022 9:18 AM MRN: [id number] Endoscopist: Nicki Reaper E. Candis Schatz , MD, [id number] Age: 57 Referring MD:  Date of Birth: [birth year] Gender: Female Account #: [id number] Procedure:                Colonoscopy Indications:              Screening for colorectal malignant neoplasm (last                            colonoscopy was 10 years ago) Medicines:                Monitored Anesthesia Care Procedure:                Pre-Anesthesia Assessment:                           - Prior to the procedure, a History and Physical                            was performed, and patient medications and                            allergies were reviewed. The patient's tolerance of                            previous anesthesia was also reviewed. The risks                            and benefits of the procedure and the sedation                            options and risks were discussed with the patient.                            All questions were answered, and informed consent                            was obtained. Prior Anticoagulants: The patient has                            taken no anticoagulant or antiplatelet agents. ASA                            Grade Assessment: II - A patient with mild systemic                            disease. After reviewing the risks and benefits,                            the patient was deemed in satisfactory condition to                            undergo the procedure.  After obtaining informed consent, the colonoscope                            was passed under direct vision. Throughout the                            procedure, the patient's blood pressure, pulse, and                            oxygen saturations were monitored continuously. The                            Olympus CF-HQ190L ([id number]) Colonoscope was                            introduced through the  anus and advanced to the the                            terminal ileum, with identification of the                            appendiceal orifice and IC valve. The colonoscopy                            was performed without difficulty. The patient                            tolerated the procedure well. The quality of the                            bowel preparation was excellent. The terminal                            ileum, ileocecal valve, appendiceal orifice, and                            rectum were photographed. The bowel preparation                            used was SUPREP via split dose instruction. Scope In: 9:32:30 AM Scope Out: 9:48:15 AM Scope Withdrawal Time: 0 hours 11 minutes 44 seconds  Total Procedure Duration: 0 hours 15 minutes 45 seconds  Findings:                 The perianal and digital rectal examinations were                            normal. Pertinent negatives include normal                            sphincter tone and no palpable rectal lesions.                           A 3 mm polyp was found in the ascending colon. The  polyp was sessile. The polyp was removed with a                            cold snare. Resection and retrieval were complete.                            Estimated blood loss was minimal.                           The exam was otherwise normal throughout the                            examined colon.                           The terminal ileum appeared normal.                           The retroflexed view of the distal rectum and anal                            verge was normal and showed no anal or rectal                            abnormalities. Complications:            No immediate complications. Estimated Blood Loss:     Estimated blood loss was minimal. Impression:               - One 3 mm polyp in the ascending colon, removed                            with a cold snare. Resected and retrieved.                            - The examined portion of the ileum was normal.                           - The distal rectum and anal verge are normal on                            retroflexion view. Recommendation:           - Patient has a contact number available for                            emergencies. The signs and symptoms of potential                            delayed complications were discussed with the                            patient. Return to normal activities tomorrow.  Written discharge instructions were provided to the                            patient.                           - Resume previous diet.                           - Continue present medications.                           - Await pathology results.                           - Repeat colonoscopy (date not yet determined) for                            surveillance based on pathology results. Sheryl Martin E. Candis Schatz, MD 12/17/2022 9:52:46 AM This report has been signed electronically.

## 2022-12-17 NOTE — Progress Notes (Signed)
[facility] History and Physical   Primary Care Physician:  Mack Hook, MD   Reason for Procedure:   Colon cancer screening  Plan:    Screening colonoscopy     HPI: Sheryl Martin is a 57 y.o. female undergoing average risk screening colonoscopy.  She has no family history of colon cancer and no chronic GI symptoms.  She reports undergoing a colonoscopy about 10 years ago which was normal.   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 Authorizing Provider  amLODipine (NORVASC) 10 MG tablet Take 1 tablet (10 mg total) by mouth daily. 11/13/21  Yes Mack Hook, MD  Calcium Citrate 250 MG TABS 1 tab by mouth daily. 11/19/22  Yes Mack Hook, MD  cyclobenzaprine (FLEXERIL) 10 MG tablet TAKE 1/2 (ONE-HALF) TO 1 TABLET  BY MOUTH  TWICE DAILY AS NEEDED FOR MUSCLE SPASM 11/13/21  Yes Mack Hook, MD  famotidine (PEPCID) 20 MG tablet Take 2 tablets (40 mg total) by mouth at bedtime. 11/13/21  Yes Mack Hook, MD  fluticasone (FLONASE) 50 MCG/ACT nasal spray Place 2 sprays into both nostrils daily. 11/13/21  Yes Mack Hook, MD  gabapentin (NEURONTIN) 100 MG capsule Take 1 capsule (100 mg total) by mouth at bedtime. 11/13/21  Yes Mack Hook, MD  loratadine (CLARITIN) 10 MG tablet Take 1 tablet (10 mg total) by mouth daily. 07/11/22  Yes Mack Hook, MD  Multiple Vitamins-Minerals (CENTRUM SILVER 50+WOMEN) TABS Take 1 tablet by mouth daily.   Yes [provider]  simvastatin (ZOCOR) 40 MG tablet  1 tab by mouth daily with evening meal 12/04/21  Yes Mack Hook, MD  naproxen (NAPROSYN) 500 MG tablet Take 1 tablet by mouth 2 (two) times daily.    [provider]  Olopatadine HCl 0.2 % SOLN 1 drop each eye daily as needed for allergies 11/13/21   Mack Hook, MD    Current Outpatient Medications  Medication Sig Dispense Refill   amLODipine (NORVASC) 10 MG tablet Take 1 tablet (10 mg total) by mouth daily. 90 tablet 3   Calcium Citrate 250 MG TABS 1 tab by mouth daily.  0   cyclobenzaprine (FLEXERIL) 10 MG tablet TAKE 1/2 (ONE-HALF) TO 1 TABLET  BY MOUTH  TWICE DAILY AS NEEDED FOR MUSCLE SPASM 30 tablet 0   famotidine (PEPCID) 20 MG tablet Take 2 tablets (40 mg total) by mouth at bedtime. 180 tablet 3   fluticasone (FLONASE) 50 MCG/ACT nasal spray Place 2 sprays into both nostrils daily. 16 g 11   gabapentin (NEURONTIN) 100 MG capsule Take 1 capsule (100 mg total) by mouth at bedtime. 30 capsule 11   loratadine (CLARITIN) 10 MG tablet Take 1 tablet (10 mg total) by mouth daily. 30 tablet 11   Multiple Vitamins-Minerals (CENTRUM SILVER 50+WOMEN) TABS Take 1 tablet by mouth daily.     simvastatin (ZOCOR) 40 MG tablet 1 tab by mouth daily with evening meal 30 tablet 11   naproxen (NAPROSYN) 500 MG tablet Take 1 tablet by mouth 2 (two) times daily.  Olopatadine HCl 0.2 % SOLN 1 drop each eye daily as needed for allergies 2.5 mL 11   Current Facility-Administered Medications  Medication Dose Route Frequency Provider Last Rate Last Admin   0.9 %  sodium chloride infusion  500 mL Intravenous Once Daryel November, MD        Allergies as of 12/17/2022 - Review Complete 12/17/2022  Allergen Reaction Noted   Robaxin [methocarbamol]  05/10/2015    Family History  Problem Relation Age of Onset   Hypertension Mother    Stroke Mother    Cancer Father        lung cancer--smoker   Hypertension Sister    Diabetes Sister    Hypertension Sister        peripheral  neuropathy   Peripheral vascular disease Sister    Hypertension Sister    Seizures Sister    Cancer Sister        Breast cancer many years ago   Peripheral vascular disease Sister    Diabetes Sister        complications of foot amputation.   Hypertension Sister    Dementia Sister        mild   Hypertension Sister    Hypertension Sister    Hypertension Sister    Diabetes Sister    Hypertension Brother    Diabetes Brother    Hypertension Brother    Alcohol abuse Brother    Cirrhosis Brother    Hypertension Brother    Alcohol abuse Brother    Hypertension Brother    Seizures Brother    Stroke Brother        cause of death   Stroke Brother    Hypertension Brother    Hypertension Brother    Colon cancer Neg Hx    Colon polyps Neg Hx    Stomach cancer Neg Hx    Esophageal cancer Neg Hx    Rectal cancer Neg Hx     Social History   Socioeconomic History   Marital status: Significant Other    Spouse name: Not on file   Number of children: 2   Years of education: 11   Highest education level: Not on file  Occupational History   Occupation: Pensions consultant at [employer]    Comment: [employer]  Tobacco Use   Smoking status: Never    Passive exposure: Never   Smokeless tobacco: Never  Vaping Use   Vaping Use: Never used  Substance and Sexual Activity   Alcohol use: Yes    Comment: rare   Drug use: No   Sexual activity: Yes    Birth control/protection: Post-menopausal, Surgical  Other Topics Concern   Not on file  Social History Narrative   Originally from [place].   Moved to [facility] 2014   Son is still in college and playing football at some level (not in college, however)  Plays for team called [employer].  Running back.   Son lives with her part time.   Daughter and her family live nearby.   Social Determinants of Health   Financial Resource Strain: Low Risk  (11/19/2022)   Overall Financial Resource Strain (CARDIA)    Difficulty of Paying Living Expenses:  Not very hard  Food Insecurity: No Food Insecurity (11/19/2022)   Hunger Vital Sign    Worried About Running Out of Food in the Last Year: Never true    Ran Out of Food in the Last Year: Never true  Transportation Needs: No Transportation Needs (  11/19/2022)   PRAPARE - Hydrologist (Medical): No    Lack of Transportation (Non-Medical): No  Physical Activity: Unknown (06/15/2019)   Exercise Vital Sign    Days of Exercise per Week: Not on file    Minutes of Exercise per Session: 80 min  Stress: Stress Concern Present (05/09/2018)   [place]    Feeling of Stress : Rather much  Social Connections: Moderately Integrated (05/09/2018)   Social Connection and Isolation Panel [NHANES]    Frequency of Communication with Friends and Family: More than three times a week    Frequency of Social Gatherings with Friends and Family: More than three times a week    Attends Religious Services: More than 4 times per year    Active Member of [employer] or Organizations: No    Attends Archivist Meetings: Never    Marital Status: Living with partner  Intimate Partner Violence: Not At Risk (11/19/2022)   Humiliation, Afraid, Rape, and Kick questionnaire    Fear of Current or Ex-Partner: No    Emotionally Abused: No    Physically Abused: No    Sexually Abused: No    Review of Systems:  All other review of systems negative except as mentioned in the HPI.  Physical Exam: Vital signs BP 116/69   Pulse 72   Temp (!) 97.3 F (36.3 C) (Temporal)   Ht '5\' 1"'$  (1.549 m)   Wt 183 lb (83 kg)   LMP 12/14/2006 (Approximate)   SpO2 100%   BMI 34.58 kg/m   General:   Alert,  Well-developed, well-nourished, pleasant and cooperative in NAD Airway:  Mallampati 2 Lungs:  Clear throughout to auscultation.   Heart:  Regular rate and rhythm; no murmurs, clicks, rubs,  or gallops. Abdomen:  Soft, nontender and  nondistended. Normal bowel sounds.   Neuro/Psych:  Normal mood and affect. A and O x 3   Staley Budzinski E. Candis Schatz, MD [facility]

## 2022-12-17 NOTE — Progress Notes (Signed)
Called to room to assist during endoscopic procedure.  Patient ID and intended procedure confirmed with present staff. Received instructions for my participation in the procedure from the performing physician.  

## 2022-12-17 NOTE — Progress Notes (Signed)
Sedate, gd SR, tolerated procedure well, VSS, report to RN 

## 2022-12-18 ENCOUNTER — Telehealth: Payer: Self-pay

## 2022-12-18 NOTE — Telephone Encounter (Signed)
  Follow up Call-     12/17/2022    8:27 AM  Call back number  Post procedure Call Back phone  # [phone]  Permission to leave phone message Yes     Follow up call made. NALM

## 2022-12-23 NOTE — Progress Notes (Signed)
Sheryl Martin,  The polyp which I removed during your recent procedure was proven to be completely benign but is considered a "pre-cancerous" polyp that MAY have grown into cancer if it had not been removed.  Studies shows that at least 20% of women over age 59 and 30% of men over age 64 have pre-cancerous polyps.  Based on current nationally recognized surveillance guidelines, I recommend that you have a repeat colonoscopy in 7 years.   If you develop any new rectal bleeding, abdominal pain or significant bowel habit changes, please contact me before then.

## 2022-12-31 ENCOUNTER — Encounter (HOSPITAL_COMMUNITY): Payer: Self-pay | Admitting: Emergency Medicine

## 2022-12-31 ENCOUNTER — Ambulatory Visit (HOSPITAL_COMMUNITY)
Admission: EM | Admit: 2022-12-31 | Discharge: 2022-12-31 | Disposition: A | Discharge status: Home / Self Care | Payer: Commercial Managed Care - HMO | Attending: Family Medicine | Admitting: Family Medicine

## 2022-12-31 ENCOUNTER — Telehealth: Payer: Self-pay | Admitting: Internal Medicine

## 2022-12-31 ENCOUNTER — Ambulatory Visit (INDEPENDENT_AMBULATORY_CARE_PROVIDER_SITE_OTHER): Payer: Commercial Managed Care - HMO

## 2022-12-31 DIAGNOSIS — G8929 Other chronic pain: Secondary | ICD-10-CM

## 2022-12-31 DIAGNOSIS — M5442 Lumbago with sciatica, left side: Secondary | ICD-10-CM

## 2022-12-31 DIAGNOSIS — M5441 Lumbago with sciatica, right side: Secondary | ICD-10-CM

## 2022-12-31 DIAGNOSIS — M545 Low back pain, unspecified: Secondary | ICD-10-CM | POA: Diagnosis not present

## 2022-12-31 MED ORDER — KETOROLAC TROMETHAMINE 30 MG/ML IJ SOLN
30.0000 mg | Freq: Once | INTRAMUSCULAR | Status: AC
  Administered 2022-12-31: 30 mg via INTRAMUSCULAR

## 2022-12-31 MED ORDER — KETOROLAC TROMETHAMINE 30 MG/ML IJ SOLN
INTRAMUSCULAR | Status: AC
  Filled 2022-12-31: qty 1

## 2022-12-31 NOTE — ED Provider Notes (Signed)
[facility]    CSN: [id number] Arrival date & time: 12/31/22  1132      History   Chief Complaint Chief Complaint  Patient presents with   Back Pain   Leg Pain    HPI Sheryl Martin is a 57 y.o. female.    Back Pain Associated symptoms: leg pain   Leg Pain Associated symptoms: back pain    Here for low back pain it has been radiating into both legs for a while.  No recent trauma or fall.  No fever and no dysuria or hematuria.  No bowel or bladder incontinence.  She has not had any rash.  No muscle weakness and no paresthesias or numbness.  She is already taking naproxen and cyclobenzaprine for upper back and shoulder pain.  Her primary care is working on evaluating that pain already.  She states that the naproxen and cyclobenzaprine are helpful for her low back pain also.  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 Authorizing Provider  amLODipine (NORVASC)  10 MG tablet Take 1 tablet (10 mg total) by mouth daily. 11/13/21   Mack Hook, MD  Calcium Citrate 250 MG TABS 1 tab by mouth daily. 11/19/22   Mack Hook, MD  cyclobenzaprine (FLEXERIL) 10 MG tablet TAKE 1/2 (ONE-HALF) TO 1 TABLET  BY MOUTH  TWICE DAILY AS NEEDED FOR MUSCLE SPASM 11/13/21   Mack Hook, MD  famotidine (PEPCID) 20 MG tablet Take 2 tablets (40 mg total) by mouth at bedtime. 11/13/21   Mack Hook, MD  fluticasone (FLONASE) 50 MCG/ACT nasal spray Place 2 sprays into both nostrils daily. 11/13/21   Mack Hook, MD  gabapentin (NEURONTIN) 100 MG capsule Take 1 capsule (100 mg total) by mouth at bedtime. 11/13/21   Mack Hook, MD  loratadine (CLARITIN) 10 MG tablet Take 1 tablet (10 mg total) by mouth daily. 07/11/22   Mack Hook, MD  Multiple Vitamins-Minerals (CENTRUM SILVER 50+WOMEN) TABS Take 1 tablet by mouth daily.    [provider]  naproxen (NAPROSYN) 500 MG tablet Take 1 tablet by mouth 2 (two) times daily.    [provider]  Olopatadine HCl 0.2 % SOLN 1 drop each eye daily as needed for allergies 11/13/21   Mack Hook, MD  simvastatin (ZOCOR) 40 MG tablet TAKE 1 TABLET BY MOUTH ONCE DAILY WITH EVENING MEAL 12/19/22   Mack Hook, MD    Family History Family History  Problem Relation Age of Onset   Hypertension Mother    Stroke Mother    Cancer Father        lung cancer--smoker   Hypertension Sister    Diabetes Sister    Hypertension Sister        peripheral neuropathy   Peripheral vascular disease Sister    Hypertension Sister    Seizures Sister    Cancer Sister        Breast cancer many years ago   Peripheral vascular disease Sister    Diabetes Sister        complications of foot amputation.   Hypertension Sister    Dementia Sister        mild   Hypertension Sister    Hypertension Sister    Hypertension Sister    Diabetes Sister    Hypertension Brother    Diabetes  Brother    Hypertension Brother    Alcohol abuse Brother    Cirrhosis Brother    Hypertension Brother    Alcohol abuse Brother    Hypertension Brother    Seizures Brother    Stroke Brother        cause of death   Stroke Brother    Hypertension Brother    Hypertension Brother    Colon cancer Neg Hx    Colon polyps Neg Hx    Stomach cancer Neg Hx    Esophageal cancer Neg Hx    Rectal cancer Neg Hx     Social History Social History   Tobacco Use   Smoking status: Never    Passive exposure: Never   Smokeless tobacco: Never  Vaping Use   Vaping Use: Never used  Substance Use Topics   Alcohol use: Yes    Comment: rare   Drug use: No     Allergies   Robaxin [methocarbamol]   Review of Systems Review of Systems  Musculoskeletal:  Positive for back pain.     Physical Exam Triage Vital Signs ED Triage Vitals  Enc Vitals Group     BP 12/31/22 1315 120/77     Pulse Rate 12/31/22 1315 65     Resp 12/31/22 1315 16     Temp 12/31/22 1315 97.7 F (36.5 C)     Temp Source 12/31/22 1315 Oral     SpO2 12/31/22 1315 98 %     Weight --      Height --      Head Circumference --      Peak Flow --      Pain Score 12/31/22 1314 10     Pain Loc --      Pain Edu? --      Excl. in [place]? --    No data found.  Updated Vital Signs BP 120/77 (BP Location: Left Arm)   Pulse 65   Temp 97.7 F (36.5 C) (Oral)   Resp 16   LMP 12/14/2006 (Approximate)   SpO2 98%   Visual Acuity Right Eye Distance:   Left Eye Distance:   Bilateral Distance:    Right Eye Near:   Left Eye Near:    Bilateral Near:     Physical Exam Vitals reviewed.  Constitutional:  General: She is not in acute distress.    Appearance: She is not ill-appearing, toxic-appearing or diaphoretic.  HENT:     Mouth/Throat:     Mouth: Mucous membranes are moist.     Pharynx: No oropharyngeal exudate or posterior oropharyngeal erythema.  Eyes:     Extraocular Movements: Extraocular movements intact.      Conjunctiva/sclera: Conjunctivae normal.     Pupils: Pupils are equal, round, and reactive to light.  Cardiovascular:     Rate and Rhythm: Normal rate and regular rhythm.     Heart sounds: No murmur heard. Pulmonary:     Effort: Pulmonary effort is normal.     Breath sounds: Normal breath sounds.  Musculoskeletal:        General: No tenderness.     Comments: Straight leg raise causes pain into the calves bilaterally  Skin:    Coloration: Skin is not pale.  Neurological:     General: No focal deficit present.     Mental Status: She is alert and oriented to person, place, and time.  Psychiatric:        Behavior: Behavior normal.      UC Treatments / Results  Labs (all labs ordered are listed, but only abnormal results are displayed) Labs Reviewed - No data to display  EKG   Radiology DG Lumbar Spine 2-3 Views  Result Date: 12/31/2022 CLINICAL DATA:  Low back pain for several months radiating into the legs. EXAM: LUMBAR SPINE - 2-3 VIEW COMPARISON:  CT of the chest, abdomen and pelvis 03/08/2015. FINDINGS: Transitional lumbosacral anatomy. In correlation with prior CT, there are small ribs at T12 and a largely sacralized L5 segment. There is a mild convex left scoliosis. The lumbar disc spaces are preserved. There is mild facet hypertrophy inferiorly. No evidence of acute fracture or pars defect. Cholecystectomy clips are noted. IMPRESSION: Transitional lumbosacral anatomy with mild spondylosis and convex left scoliosis. No acute osseous findings. Electronically Signed   By: Richardean Sale M.D.   On: 12/31/2022 13:53    Procedures Procedures (including critical care time)  Medications Ordered in UC Medications  ketorolac (TORADOL) 30 MG/ML injection 30 mg (has no administration in time range)    Initial Impression / Assessment and Plan / UC Course  I have reviewed the triage vital signs and the nursing notes.  Pertinent labs & imaging results that were available  during my care of the patient were reviewed by me and considered in my medical decision making (see chart for details).        X-ray shows some mild scoliosis.  After discussion, we decided to leave her naproxen and Flexeril the same, and she is given a shot of Toradol here.  She is also given stretches for her upper and lower back, and she is going to follow-up with her primary care about the low back pain Final Clinical Impressions(s) / UC Diagnoses   Final diagnoses:  Chronic bilateral low back pain with bilateral sciatica     Discharge Instructions      Your x-rays did not show any broken bones, but there was a little bit of scoliosis and some degenerative change consistent with arthritis.  Continue your naproxen and cyclobenzaprine the same  You have been given a shot of Toradol 30 mg today.  Try doing the stretches and exercises for your back muscles.  Follow-up with your primary care about the low back pain.     ED Prescriptions   None  PDMP not reviewed this encounter.   Barrett Henle, MD 12/31/22 [phone]

## 2022-12-31 NOTE — ED Triage Notes (Signed)
Pt c/o bilateral leg pains for couple weeks. C/o generalized back pain since last year. Reports stands all day at work and [place]. Is supposed to be getting MRI due to shoulder pain. Pt taking pain medication that is given for shoulders. Denies falls or injury.

## 2022-12-31 NOTE — Discharge Instructions (Signed)
Your x-rays did not show any broken bones, but there was a little bit of scoliosis and some degenerative change consistent with arthritis.  Continue your naproxen and cyclobenzaprine the same  You have been given a shot of Toradol 30 mg today.  Try doing the stretches and exercises for your back muscles.  Follow-up with your primary care about the low back pain.

## 2023-01-01 NOTE — Telephone Encounter (Signed)
Patient has been instructed to stop simvastatin for a month. Patient has been scheduled to return for cholesterol in one month along with labs.  Patient has also been scheduled for follow up and CPE

## 2023-01-01 NOTE — Telephone Encounter (Signed)
Patient has been scheduled

## 2023-01-15 ENCOUNTER — Ambulatory Visit: Payer: Commercial Managed Care - HMO

## 2023-01-31 ENCOUNTER — Other Ambulatory Visit: Payer: Self-pay | Admitting: Internal Medicine

## 2023-02-05 ENCOUNTER — Other Ambulatory Visit (INDEPENDENT_AMBULATORY_CARE_PROVIDER_SITE_OTHER): Payer: Commercial Managed Care - HMO

## 2023-02-05 DIAGNOSIS — E782 Mixed hyperlipidemia: Secondary | ICD-10-CM

## 2023-02-06 LAB — LIPID PANEL W/O CHOL/HDL RATIO
Cholesterol, Total: 248 mg/dL — ABNORMAL HIGH (ref 100–199)
HDL: 67 mg/dL (ref >39)
LDL Chol Calc (NIH): 167 mg/dL — ABNORMAL HIGH (ref 0–99)
Triglycerides: 81 mg/dL (ref 0–149)
VLDL Cholesterol Cal: 14 mg/dL (ref 5–40)

## 2023-02-07 ENCOUNTER — Other Ambulatory Visit: Payer: Self-pay

## 2023-02-13 ENCOUNTER — Ambulatory Visit: Payer: Commercial Managed Care - HMO

## 2023-02-20 ENCOUNTER — Ambulatory Visit: Payer: Commercial Managed Care - HMO | Admitting: Internal Medicine

## 2023-03-05 ENCOUNTER — Ambulatory Visit
Admission: RE | Admit: 2023-03-05 | Discharge: 2023-03-05 | Disposition: A | Discharge status: Home / Self Care | Payer: Commercial Managed Care - HMO | Source: Ambulatory Visit | Attending: Internal Medicine | Admitting: Internal Medicine

## 2023-03-05 ENCOUNTER — Ambulatory Visit: Payer: Commercial Managed Care - HMO

## 2023-03-05 ENCOUNTER — Encounter: Payer: Self-pay | Admitting: Nurse Practitioner

## 2023-03-05 DIAGNOSIS — Z1231 Encounter for screening mammogram for malignant neoplasm of breast: Secondary | ICD-10-CM

## 2023-03-11 ENCOUNTER — Other Ambulatory Visit: Payer: Self-pay | Admitting: Internal Medicine

## 2023-03-24 ENCOUNTER — Other Ambulatory Visit: Payer: Self-pay | Admitting: Internal Medicine

## 2023-03-24 MED ORDER — ATORVASTATIN CALCIUM 40 MG PO TABS: ORAL_TABLET | ORAL | 11 refills | Status: DC

## 2023-04-28 ENCOUNTER — Other Ambulatory Visit: Payer: Self-pay | Admitting: Internal Medicine

## 2023-05-02 ENCOUNTER — Ambulatory Visit: Payer: Self-pay | Admitting: Internal Medicine

## 2023-05-21 ENCOUNTER — Ambulatory Visit: Payer: Self-pay | Admitting: Internal Medicine

## 2023-05-27 ENCOUNTER — Other Ambulatory Visit: Payer: Self-pay

## 2023-05-29 ENCOUNTER — Telehealth: Payer: Self-pay

## 2023-05-29 NOTE — Telephone Encounter (Signed)
Patient got denied for atorvastatin calcium 40mg . Reason provided is that patient needs to be titrated up from the lower dose.

## 2023-05-30 MED ORDER — ATORVASTATIN CALCIUM 40 MG PO TABS: ORAL_TABLET | ORAL | 3 refills | Status: DC

## 2023-06-04 NOTE — Telephone Encounter (Signed)
Patient notified of medication changes

## 2023-07-15 ENCOUNTER — Encounter: Payer: Self-pay | Admitting: Internal Medicine

## 2023-07-18 ENCOUNTER — Inpatient Hospital Stay: Admission: RE | Admit: 2023-07-18 | Payer: Commercial Managed Care - HMO | Source: Ambulatory Visit

## 2023-07-23 ENCOUNTER — Ambulatory Visit (HOSPITAL_COMMUNITY)
Admission: EM | Admit: 2023-07-23 | Discharge: 2023-07-23 | Disposition: A | Discharge status: Home / Self Care | Payer: Self-pay | Attending: Internal Medicine | Admitting: Internal Medicine

## 2023-07-23 ENCOUNTER — Encounter (HOSPITAL_COMMUNITY): Payer: Self-pay

## 2023-07-23 DIAGNOSIS — M5442 Lumbago with sciatica, left side: Secondary | ICD-10-CM

## 2023-07-23 MED ORDER — PREDNISONE 20 MG PO TABS: 40.0000 mg | ORAL_TABLET | Freq: Every day | ORAL | 0 refills | Status: AC

## 2023-07-23 NOTE — Discharge Instructions (Addendum)
Your low back pain is due to sciatic nerve pain. I gave you a steroid shot in the clinic to help reduce inflammation and pain. Start taking prednisone once daily for the next 5 days starting tomorrow.  Take this with food to avoid stomach upset. Take muscle relaxer at bedtime as needed for muscle spasm.  No that the muscle relaxer may make you sleepy, so do not take this during the daytime or when drinking/driving.  Apply heat to the low back and use gentle range of motion exercises to prevent stiffness to the area.  Please schedule an appointment for follow-up with your primary care provider or the orthopedic provider listed on your paperwork.

## 2023-07-23 NOTE — ED Provider Notes (Signed)
[facility]    CSN: [id number] Arrival date & time: 07/23/23  9562      History   Chief Complaint Chief Complaint  Patient presents with   Back Pain    HPI Sheryl Martin is a 57 y.o. female.   Sheryl Martin is a 57 y.o. female presenting for chief complaint of lower back pain that started approximately 6 days ago. Pain is localized to the left lower back and radiates down the posterior aspect of the left leg. Pain is most intense to the left hip/buttock area. Pain is worsened by certain movements and positions. Currently 10/10 and described as sharp and pulling pain. No fall, trauma, numbness or tingling, saddle paresthesia, changes to bowel or urinary habits, extremity weakness, radicular symptoms. She has applied heat/ice, epsom salt soaks, and tried using muscle relaxer without much relief. Epsom salt soaks give 5-10 minutes of relief, then pain returns intensely.  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  She believes this may have triggered her low back pain.   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 Authorizing Provider  predniSONE (DELTASONE) 20 MG tablet Take 2 tablets (40 mg total) by mouth daily for 5 days. 07/23/23 07/28/23 Yes Marvina Danner, Donavan Burnet, FNP  amLODipine (NORVASC) 10 MG tablet TAKE 1 TABLET BY MOUTH ONCE DAILY . APPOINTMENT REQUIRED FOR FUTURE REFILLS 02/07/23   Julieanne Manson, MD  atorvastatin (LIPITOR) 40 MG tablet 1 tab by mouth with evening meals daily. 05/30/23   Julieanne Manson, MD  Calcium Citrate 250 MG TABS 1 tab by mouth daily. 11/19/22   Julieanne Manson, MD  cyclobenzaprine (FLEXERIL) 10 MG tablet TAKE 1/2 (ONE-HALF) TO 1 TABLET  BY MOUTH  TWICE DAILY AS NEEDED FOR MUSCLE SPASM 11/13/21   Julieanne Manson, MD  famotidine (PEPCID) 20 MG tablet TAKE 2 TABLETS BY MOUTH AT BEDTIME 02/07/23   Julieanne Manson, MD  fluticasone [facility]) 50 MCG/ACT nasal spray Use 2 spray(s) in each nostril once daily 05/03/23   Julieanne Manson, MD  gabapentin (  NEURONTIN) 100 MG capsule Take 1 capsule by mouth at bedtime 03/18/23   Julieanne Manson, MD  loratadine (CLARITIN) 10 MG tablet Take 1 tablet (10 mg total) by mouth daily. 07/11/22   Julieanne Manson, MD  Multiple Vitamins-Minerals (CENTRUM SILVER 50+WOMEN) TABS Take 1 tablet by mouth daily.    [provider]  naproxen (NAPROSYN) 500 MG tablet Take 1 tablet by mouth 2 (two) times daily.    [provider]  Olopatadine HCl 0.2 % SOLN 1 drop each eye daily as needed for allergies 11/13/21   Julieanne Manson, MD    Family History Family History  Problem Relation Age of Onset   Hypertension Mother    Stroke Mother    Cancer Father        lung cancer--smoker   Hypertension Sister    Diabetes Sister    Hypertension Sister        peripheral  neuropathy   Peripheral vascular disease Sister    Hypertension Sister    Seizures Sister    Cancer Sister        Breast cancer many years ago   Peripheral vascular disease Sister    Diabetes Sister        complications of foot amputation.   Hypertension Sister    Dementia Sister        mild   Hypertension Sister    Hypertension Sister    Hypertension Sister    Diabetes Sister    Hypertension Brother    Diabetes Brother    Hypertension Brother    Alcohol abuse Brother    Cirrhosis Brother    Hypertension Brother    Alcohol abuse Brother    Hypertension Brother    Seizures Brother    Stroke Brother        cause of death   Stroke Brother    Hypertension Brother    Hypertension Brother    Colon cancer Neg Hx    Colon polyps Neg Hx    Stomach cancer Neg Hx    Esophageal cancer Neg Hx    Rectal cancer Neg Hx     Social History Social History   Tobacco Use   Smoking status: Never    Passive exposure: Never   Smokeless tobacco: Never  Vaping Use   Vaping status: Never Used  Substance Use Topics   Alcohol use: Yes    Comment: rare   Drug use: No     Allergies   Robaxin [methocarbamol]   Review of Systems Review of Systems  Musculoskeletal:  Positive for back pain.  Per HPI   Physical Exam Triage Vital Signs ED Triage Vitals  Encounter Vitals Group     BP 07/23/23 0856 (!) 145/79     Systolic BP Percentile --      Diastolic BP Percentile --      Pulse Rate 07/23/23 0856 68     Resp 07/23/23 0856 16     Temp 07/23/23 0856 98.2 F (36.8 C)     Temp Source 07/23/23 0856 Oral     SpO2 07/23/23 0856 98 %     Weight 07/23/23 0855 189 lb (85.7 kg)     Height 07/23/23 0855 5\' 2"  (1.575 m)     Head Circumference --      Peak Flow --      Pain Score 07/23/23 0853 10     Pain Loc --      Pain Education --      Exclude from  Growth Chart --    No data found.  Updated Vital Signs BP (!) 145/79 (BP Location: Left Arm)   Pulse 68   Temp 98.2 F (36.8  C) (Oral)   Resp 16   Ht 5\' 2"  (1.575 m)   Wt 189 lb (85.7 kg)   LMP 12/14/2006 (Approximate)   SpO2 98%   BMI 34.57 kg/m   Visual Acuity Right Eye Distance:   Left Eye Distance:   Bilateral Distance:    Right Eye Near:   Left Eye Near:    Bilateral Near:     Physical Exam Vitals and nursing note reviewed.  Constitutional:      Appearance: She is not ill-appearing or toxic-appearing.  HENT:     Head: Normocephalic and atraumatic.     Right Ear: Hearing and external ear normal.     Left Ear: Hearing and external ear normal.     Nose: Nose normal.     Mouth/Throat:     Lips: Pink.  Eyes:     General: Lids are normal. Vision grossly intact. Gaze aligned appropriately.     Extraocular Movements: Extraocular movements intact.     Conjunctiva/sclera: Conjunctivae normal.  Cardiovascular:     Rate and Rhythm: Normal rate and regular rhythm.     Heart sounds: Normal heart sounds, S1 normal and S2 normal.  Pulmonary:     Effort: Pulmonary effort is normal. No respiratory distress.     Breath sounds: Normal breath sounds and air entry.  Musculoskeletal:     Cervical back: Normal and neck supple.     Thoracic back: Normal.     Lumbar back: Tenderness present. No swelling, edema, deformity, signs of trauma, lacerations, spasms or bony tenderness. Normal range of motion. Negative right straight leg raise test and negative left straight leg raise test. No scoliosis.     Comments: TTP to the left sacroiliac region. Non-tender to the midline spine. Strength and sensation intact to bilateral upper and lower extremities (5/5). Moves all 4 extremities with normal coordination voluntarily.   Skin:    General: Skin is warm and dry.     Capillary Refill: Capillary refill takes less than 2 seconds.     Findings: No rash.  Neurological:     General: No focal deficit present.     Mental Status: She is alert and oriented to person, place, and time. Mental status is at baseline.     Cranial  Nerves: No dysarthria or facial asymmetry.  Psychiatric:        Mood and Affect: Mood normal.        Speech: Speech normal.        Behavior: Behavior normal.        Thought Content: Thought content normal.        Judgment: Judgment normal.      UC Treatments / Results  Labs (all labs ordered are listed, but only abnormal results are displayed) Labs Reviewed - No data to display  EKG   Radiology No results found.  Procedures Procedures (including critical care time)  Medications Ordered in UC Medications - No data to display  Initial Impression / Assessment and Plan / UC Course  I have reviewed the triage vital signs and the nursing notes.  Pertinent labs & imaging results that were available during my care of the patient were reviewed by me and considered in my medical decision making (see chart for details).   1. Acute left-sided low back pain with left-sided  sciatica Evaluation suggests sciatic nerve pain.  No red flag signs/symptoms found on exam indicating need for referral to ED for further workup.  Deferred imaging based on atraumatic mechanism of injury.  Pain has not responded well to NSAIDs, therefore will treat with short course of steroid to be started today.  Muscle relaxer as needed for muscular involvement, drowsiness precautions discussed. Follow-up with orthopedics as needed, walking referral given.   Counseled patient on potential for adverse effects with medications prescribed/recommended today, strict ER and return-to-clinic precautions discussed, patient verbalized understanding.    Final Clinical Impressions(s) / UC Diagnoses   Final diagnoses:  Acute left-sided low back pain with left-sided sciatica     Discharge Instructions      Your low back pain is due to sciatic nerve pain. I gave you a steroid shot in the clinic to help reduce inflammation and pain. Start taking prednisone once daily for the next 5 days starting tomorrow.  Take this  with food to avoid stomach upset. Take muscle relaxer at bedtime as needed for muscle spasm.  No that the muscle relaxer may make you sleepy, so do not take this during the daytime or when drinking/driving.  Apply heat to the low back and use gentle range of motion exercises to prevent stiffness to the area.  Please schedule an appointment for follow-up with your primary care provider or the orthopedic provider listed on your paperwork.      ED Prescriptions     Medication Sig Dispense Auth. Provider   predniSONE (DELTASONE) 20 MG tablet Take 2 tablets (40 mg total) by mouth daily for 5 days. 10 tablet Carlisle Beers, FNP      PDMP not reviewed this encounter.   Carlisle Beers, [place] 07/23/23 [phone]

## 2023-07-23 NOTE — ED Triage Notes (Signed)
Patient here today with c/o left side LB pain that radiates down her left leg X 1 week. No known injury. She has tried Icy/Hot, epsom salt hot baths, and a muscle relaxer with no relief.

## 2023-08-05 ENCOUNTER — Ambulatory Visit (HOSPITAL_COMMUNITY)
Admission: EM | Admit: 2023-08-05 | Discharge: 2023-08-05 | Disposition: A | Discharge status: Home / Self Care | Payer: Self-pay | Attending: Family Medicine | Admitting: Family Medicine

## 2023-08-05 ENCOUNTER — Encounter (HOSPITAL_COMMUNITY): Payer: Self-pay

## 2023-08-05 DIAGNOSIS — M5432 Sciatica, left side: Secondary | ICD-10-CM

## 2023-08-05 MED ORDER — KETOROLAC TROMETHAMINE 30 MG/ML IJ SOLN
30.0000 mg | Freq: Once | INTRAMUSCULAR | Status: AC
  Administered 2023-08-05: 30 mg via INTRAMUSCULAR

## 2023-08-05 MED ORDER — KETOROLAC TROMETHAMINE 30 MG/ML IJ SOLN
INTRAMUSCULAR | Status: AC
Start: 2023-08-05 — End: ?
  Filled 2023-08-05: qty 1

## 2023-08-05 MED ORDER — METHYLPREDNISOLONE 4 MG PO TBPK: ORAL_TABLET | ORAL | 0 refills | Status: DC

## 2023-08-05 NOTE — Discharge Instructions (Addendum)
Please follow-up with your PCP and get a referral in order to get a epidural steroid injection of your back.  An MRI would also be beneficial for you.  Please also ask to be seen by physical therapy.

## 2023-08-05 NOTE — ED Provider Notes (Signed)
[facility]    CSN: [id number] Arrival date & time: 08/05/23  0800      History   Chief Complaint Chief Complaint  Patient presents with   Back Pain    HPI Sheryl Martin is a 57 y.o. female.   Patient presenting with left-sided back/sciatic pain has been going on for a while now.  Patient was seen at the beginning of this month and at that time took a steroid which states calm the pain down a bit but her pain has returned.  Patient reports some left-sided radiation of pain down her leg in the posterior aspect.  Notes some numbness and tingling.  Patient does take gabapentin with only 100 mg daily.  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 Authorizing Provider  methylPREDNISolone (MEDROL DOSEPAK) 4 MG TBPK tablet Follow instructions on package 08/05/23  Yes Brenton Grills, MD  amLODipine (NORVASC) 10 MG tablet  TAKE 1 TABLET BY MOUTH ONCE DAILY . APPOINTMENT REQUIRED FOR FUTURE REFILLS 02/07/23   Julieanne Manson, MD  atorvastatin (LIPITOR) 40 MG tablet 1 tab by mouth with evening meals daily. 05/30/23   Julieanne Manson, MD  Calcium Citrate 250 MG TABS 1 tab by mouth daily. 11/19/22   Julieanne Manson, MD  cyclobenzaprine (FLEXERIL) 10 MG tablet TAKE 1/2 (ONE-HALF) TO 1 TABLET  BY MOUTH  TWICE DAILY AS NEEDED FOR MUSCLE SPASM 11/13/21   Julieanne Manson, MD  famotidine (PEPCID) 20 MG tablet TAKE 2 TABLETS BY MOUTH AT BEDTIME 02/07/23   Julieanne Manson, MD  fluticasone [facility]) 50 MCG/ACT nasal spray Use 2 spray(s) in each nostril once daily 05/03/23   Julieanne Manson, MD  gabapentin (NEURONTIN) 100 MG capsule Take 1 capsule by mouth at bedtime 03/18/23   Julieanne Manson, MD  loratadine (CLARITIN) 10 MG tablet Take 1 tablet (10 mg total) by mouth daily. 07/11/22   Julieanne Manson, MD  Multiple Vitamins-Minerals (CENTRUM SILVER 50+WOMEN) TABS Take 1 tablet by mouth daily.    [provider]  naproxen (NAPROSYN) 500 MG tablet Take 1 tablet by  mouth 2 (two) times daily.    [provider]  Olopatadine HCl 0.2 % SOLN 1 drop each eye daily as needed for allergies 11/13/21   Julieanne Manson, MD    Family History Family History  Problem Relation Age of Onset   Hypertension Mother    Stroke Mother    Cancer Father        lung cancer--smoker   Hypertension Sister    Diabetes Sister    Hypertension Sister        peripheral neuropathy   Peripheral vascular disease Sister    Hypertension Sister    Seizures Sister    Cancer Sister        Breast cancer many years ago   Peripheral vascular disease Sister    Diabetes Sister        complications of foot amputation.   Hypertension Sister    Dementia Sister        mild   Hypertension Sister    Hypertension Sister    Hypertension Sister    Diabetes Sister    Hypertension Brother    Diabetes Brother     Hypertension Brother    Alcohol abuse Brother    Cirrhosis Brother    Hypertension Brother    Alcohol abuse Brother    Hypertension Brother    Seizures Brother    Stroke Brother        cause of death   Stroke Brother    Hypertension Brother    Hypertension Brother    Colon cancer Neg Hx    Colon polyps Neg Hx    Stomach cancer Neg Hx    Esophageal cancer Neg Hx    Rectal cancer Neg Hx     Social History Social History   Tobacco Use   Smoking status: Never    Passive exposure: Never   Smokeless tobacco: Never  Vaping Use   Vaping status: Never Used  Substance Use Topics   Alcohol use: Yes    Comment: rare   Drug use: No     Allergies   Robaxin [methocarbamol]   Review of Systems Review of Systems  Musculoskeletal:  Positive for back pain.     Physical Exam Triage Vital Signs ED Triage Vitals  Encounter Vitals Group     BP 08/05/23 0818 127/73     Systolic BP Percentile --      Diastolic BP Percentile --      Pulse Rate 08/05/23 0818 70     Resp 08/05/23 0818 16     Temp 08/05/23 0818 98.1 F (36.7 C)     Temp Source 08/05/23 0818 Oral     SpO2 08/05/23 0818 97 %     Weight 08/05/23 0818 190 lb (86.2 kg)     Height 08/05/23 0818 5\' 2"  (1.575 m)     Head Circumference --      Peak Flow --      Pain Score 08/05/23 0817 10     Pain Loc --      Pain Education --      Exclude from Growth Chart --    No data found.  Updated Vital Signs BP 127/73 (BP Location: Left Arm)   Pulse 70   Temp 98.1 F (36.7 C) (Oral)   Resp 16   Ht 5\' 2"  (1.575 m)   Wt 86.2 kg   LMP 12/14/2006 (Approximate)   SpO2 97%   BMI 34.75 kg/m   Visual Acuity Right Eye Distance:  Left Eye Distance:   Bilateral Distance:    Right Eye Near:   Left Eye Near:    Bilateral Near:     Physical Exam Lumbar spine:  - Inspection: no gross deformity or scoliosis; no swelling or ecchymosis. No skin changes - Palpation: No TTP over the spinous processes, paraspinal muscles,  or SI joints b/l - ROM: full active ROM of the lumbar spine in flexion and extension without pain - Strength: 5/5 strength of lower extremity in L4-S1 nerve root distributions b/l  *L1/L2: Hip Flexion & Abduction  *L3/L4: Knee Extension  *L4/L5: Ankle Dorsiflexion  *L5: Great Toe Extension  *S1: Ankle Plantar Flexion - Neuro: sensation intact in the L4-S1 nerve root distribution b/l, 2+ L4 and S1 reflexes - Provocative Testing: Positive straight leg raise   UC Treatments / Results  Labs (all labs ordered are listed, but only abnormal results are displayed) Labs Reviewed - No data to display  EKG   Radiology No results found.  Procedures Procedures (including critical care time)  Medications Ordered in UC Medications  ketorolac (TORADOL) 30 MG/ML injection 30 mg (has no administration in time range)    Initial Impression / Assessment and Plan / UC Course  I have reviewed the triage vital signs and the nursing notes.  Pertinent labs & imaging results that were available during my care of the patient were reviewed by me and considered in my medical decision making (see chart for details).     Patient likely dealing with sciatic pain of the left side.  Patient did have a lumbar x-ray in March of this year which did show some degenerative changes.  Patient was advised that this pain may recur and she will need to follow-up with pain management/spine in order to get a steroid injection of the back given the persistence of her symptoms.  At this time, will give patient a Toradol injection as well as a Medrol Dosepak to decrease the nerve pain.  Patient was advised to follow-up with her PCP to get the referrals to the appropriate people.  Patient was understanding and agreeable with plan. Final Clinical Impressions(s) / UC Diagnoses   Final diagnoses:  Sciatica of left side     Discharge Instructions      Please follow-up with your PCP and get a referral in order to get a  epidural steroid injection of your back.  An MRI would also be beneficial for you.  Please also ask to be seen by physical therapy.     ED Prescriptions     Medication Sig Dispense Auth. Provider   methylPREDNISolone (MEDROL DOSEPAK) 4 MG TBPK tablet Follow instructions on package 1 each Brenton Grills, MD      PDMP not reviewed this encounter.   Brenton Grills, MD 08/05/23 [phone]

## 2023-08-05 NOTE — ED Triage Notes (Signed)
Patient here today with c/o LB pain radiating down left leg. Patient was here 2 weeks ago. Medications helped some but pain came back after completing the medicine.

## 2023-08-26 ENCOUNTER — Ambulatory Visit: Payer: Self-pay | Admitting: Internal Medicine

## 2023-09-09 ENCOUNTER — Ambulatory Visit: Payer: Self-pay | Admitting: Internal Medicine

## 2023-11-11 ENCOUNTER — Other Ambulatory Visit: Payer: Self-pay | Admitting: Internal Medicine

## 2023-11-21 ENCOUNTER — Ambulatory Visit (INDEPENDENT_AMBULATORY_CARE_PROVIDER_SITE_OTHER): Payer: No Typology Code available for payment source

## 2023-11-21 ENCOUNTER — Encounter: Payer: Self-pay | Admitting: Internal Medicine

## 2023-11-21 ENCOUNTER — Encounter (HOSPITAL_COMMUNITY): Payer: Self-pay

## 2023-11-21 ENCOUNTER — Ambulatory Visit (HOSPITAL_COMMUNITY)
Admission: EM | Admit: 2023-11-21 | Discharge: 2023-11-21 | Disposition: A | Discharge status: Home / Self Care | Payer: No Typology Code available for payment source | Attending: Family Medicine | Admitting: Family Medicine

## 2023-11-21 DIAGNOSIS — R051 Acute cough: Secondary | ICD-10-CM

## 2023-11-21 DIAGNOSIS — R9431 Abnormal electrocardiogram [ECG] [EKG]: Secondary | ICD-10-CM | POA: Diagnosis not present

## 2023-11-21 DIAGNOSIS — J101 Influenza due to other identified influenza virus with other respiratory manifestations: Secondary | ICD-10-CM | POA: Diagnosis not present

## 2023-11-21 DIAGNOSIS — R0789 Other chest pain: Secondary | ICD-10-CM

## 2023-11-21 LAB — POCT INFLUENZA A/B
Influenza A, POC: POSITIVE — AB
Influenza B, POC: NEGATIVE

## 2023-11-21 MED ORDER — ACETAMINOPHEN 325 MG PO TABS
ORAL_TABLET | ORAL | Status: AC
  Filled 2023-11-21: qty 2

## 2023-11-21 MED ORDER — OSELTAMIVIR PHOSPHATE 75 MG PO CAPS: 75.0000 mg | ORAL_CAPSULE | Freq: Two times a day (BID) | ORAL | 0 refills | Status: DC

## 2023-11-21 MED ORDER — ACETAMINOPHEN 325 MG PO TABS
650.0000 mg | ORAL_TABLET | Freq: Once | ORAL | Status: AC
  Administered 2023-11-21: 650 mg via ORAL

## 2023-11-21 MED ORDER — BENZONATATE 100 MG PO CAPS: 100.0000 mg | ORAL_CAPSULE | Freq: Three times a day (TID) | ORAL | 0 refills | Status: DC

## 2023-11-21 NOTE — ED Provider Notes (Signed)
 [facility]    CSN: [id number] Arrival date & time: 11/21/23  9070      History   Chief Complaint Chief Complaint  Patient presents with   Cough   rib cage pain    HPI Sheryl Martin is a 58 y.o. female.   Patient presents today with a 36-hour history of URI symptoms.  She reports cough, fever, nausea, sharp chest pain.  Denies any shortness of breath, vomiting, diaphoresis, weakness.  She has tried Coricidin without improvement of symptoms.  She denies any known sick contacts but does work around many individuals some of whom have been coughing.  She does wear her mask at work.  She is up-to-date on influenza and COVID-19 vaccinations.  Denies any recent COVID-19 infection.  She denies history of asthma, COPD, smoking.  She does have seasonal allergies but has been taking her allergy medicine.  She denies any recent antibiotics or steroids.  She describes the chest discomfort as 6 on a 0-10 pain scale, sharp, worse with coughing, no alleviating factors identified.  She does have several cardiovascular risk factors including hyperlipidemia and hypertension.  Denies history of diabetes or smoking.    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 Authorizing Provider  benzonatate  (TESSALON ) 100 MG capsule Take 1 capsule (100 mg total) by mouth every 8 (eight) hours. 11/21/23  Yes Alexander Mcauley K, PA-C  amLODipine  (NORVASC ) 10 MG tablet TAKE 1 TABLET BY MOUTH ONCE DAILY . APPOINTMENT REQUIRED FOR FUTURE REFILLS 11/11/23   Adella Norris, MD  atorvastatin  (LIPITOR) 40 MG tablet 1 tab by mouth with evening meals daily. 05/30/23   Adella Norris, MD  Calcium  Citrate 250 MG TABS 1 tab by mouth daily. 11/19/22   Adella Norris, MD  famotidine  (PEPCID ) 20 MG tablet TAKE 2 TABLETS BY MOUTH AT BEDTIME 11/11/23   Adella Norris, MD  fluticasone  (FLONASE ) 50 MCG/ACT nasal spray Use 2 spray(s) in each nostril once daily 05/03/23   Adella Norris, MD  gabapentin  (NEURONTIN ) 100 MG capsule Take 1 capsule by mouth at bedtime 03/18/23   Adella Norris, MD  loratadine  (CLARITIN ) 10 MG tablet Take  1 tablet (10 mg total) by mouth daily. 07/11/22   Adella Norris, MD  Multiple Vitamins-Minerals (CENTRUM SILVER 50+WOMEN) TABS Take 1 tablet by mouth daily.    [provider]  naproxen  (NAPROSYN ) 500 MG tablet Take 1 tablet by mouth 2 (two) times daily.    [provider]  Olopatadine  HCl 0.2 % SOLN 1 drop each eye daily as needed for allergies 11/13/21   Adella Norris, MD    Family History Family History  Problem Relation Age of Onset   Hypertension Mother    Stroke Mother    Cancer Father        lung cancer--smoker   Hypertension Sister    Diabetes Sister    Hypertension Sister        peripheral neuropathy   Peripheral vascular disease Sister    Hypertension Sister    Seizures Sister    Cancer Sister        Breast cancer many  years ago   Peripheral vascular disease Sister    Diabetes Sister        complications of foot amputation.   Hypertension Sister    Dementia Sister        mild   Hypertension Sister    Hypertension Sister    Hypertension Sister    Diabetes Sister    Hypertension Brother    Diabetes Brother    Hypertension Brother    Alcohol abuse Brother    Cirrhosis Brother    Hypertension Brother    Alcohol abuse Brother    Hypertension Brother    Seizures Brother    Stroke Brother        cause of death   Stroke Brother    Hypertension Brother    Hypertension Brother    Colon cancer Neg Hx    Colon polyps Neg Hx    Stomach cancer Neg Hx    Esophageal cancer Neg Hx    Rectal cancer Neg Hx     Social History Social History   Tobacco Use   Smoking status: Never    Passive exposure: Never   Smokeless tobacco: Never  Vaping Use   Vaping status: Never Used  Substance Use Topics   Alcohol use: Yes    Comment: rare   Drug use: No     Allergies   Robaxin  [methocarbamol ]   Review of Systems Review of Systems  Constitutional:  Positive for activity change, fatigue and fever. Negative for appetite change.  HENT:  Positive for congestion. Negative for sinus pressure, sneezing and sore throat.   Respiratory:  Positive for cough. Negative for shortness of breath.   Cardiovascular:  Positive for chest pain.  Gastrointestinal:  Positive for nausea. Negative for abdominal pain, diarrhea and vomiting.  Musculoskeletal:  Negative for arthralgias and myalgias.  Neurological:  Negative for dizziness, light-headedness and headaches.     Physical Exam Triage Vital Signs ED Triage Vitals  Encounter Vitals Group     BP 11/21/23 0934 114/77     Systolic BP Percentile --      Diastolic BP Percentile --      Pulse Rate 11/21/23 0934 94     Resp 11/21/23 0934 16     Temp 11/21/23 0934 (!) 101.7 F (38.7 C)     Temp src --      SpO2 11/21/23 0934 97 %     Weight --      Height --       Head Circumference --  Peak Flow --      Pain Score 11/21/23 0936 6     Pain Loc --      Pain Education --      Exclude from Growth Chart --    No data found.  Updated Vital Signs BP 114/77 (BP Location: Left Arm)   Pulse 94   Temp 99.8 F (37.7 C) (Oral)   Resp 16   LMP 12/14/2006 (Approximate)   SpO2 97%   Visual Acuity Right Eye Distance:   Left Eye Distance:   Bilateral Distance:    Right Eye Near:   Left Eye Near:    Bilateral Near:     Physical Exam Vitals reviewed.  Constitutional:      General: She is awake. She is not in acute distress.    Appearance: Normal appearance. She is well-developed. She is not ill-appearing.     Comments: Very pleasant female appears stated age in no acute distress sitting comfortably in exam room  HENT:     Head: Normocephalic and atraumatic.     Right Ear: Tympanic membrane, ear canal and external ear normal. Tympanic membrane is not erythematous or bulging.     Left Ear: Tympanic membrane, ear canal and external ear normal. Tympanic membrane is not erythematous or bulging.     Nose:     Right Sinus: No maxillary sinus tenderness or frontal sinus tenderness.     Left Sinus: No maxillary sinus tenderness or frontal sinus tenderness.     Mouth/Throat:     Pharynx: Uvula midline. No oropharyngeal exudate or posterior oropharyngeal erythema.  Cardiovascular:     Rate and Rhythm: Normal rate and regular rhythm.     Heart sounds: Normal heart sounds, S1 normal and S2 normal. No murmur heard. Pulmonary:     Effort: Pulmonary effort is normal.     Breath sounds: Normal breath sounds. No wheezing, rhonchi or rales.     Comments: Reactive cough with breathing.  No adventitious lung sounds on exam. Psychiatric:        Behavior: Behavior is cooperative.      UC Treatments / Results  Labs (all labs ordered are listed, but only abnormal results are displayed) Labs Reviewed  POCT INFLUENZA A/B - Abnormal; Notable for the following  components:      Result Value   Influenza A, POC Positive (*)    All other components within normal limits    EKG   Radiology DG Chest 2 View Result Date: 11/21/2023 CLINICAL DATA:  Cough and bilateral pleuritic chest pain for several days. EXAM: CHEST - 2 VIEW COMPARISON:  09/29/2022 FINDINGS: The heart size and mediastinal contours are within normal limits. Both lungs are clear. The visualized skeletal structures are unremarkable. IMPRESSION: No active cardiopulmonary disease. Electronically Signed   By: Norleen DELENA Kil M.D.   On: 11/21/2023 10:50    Procedures Procedures (including critical care time)  Medications Ordered in UC Medications  acetaminophen  (TYLENOL ) tablet 650 mg (650 mg Oral Given 11/21/23 0942)    Initial Impression / Assessment and Plan / UC Course  I have reviewed the triage vital signs and the nursing notes.  Pertinent labs & imaging results that were available during my care of the patient were reviewed by me and considered in my medical decision making (see chart for details).     Patient is well-appearing, nontoxic, nontachycardic.  She was initially febrile but this resolved with a dose of antipyretics in clinic.  Low suspicion for CAD as  etiology of chest pain given her clinical presentation and INTERCHEST score of 0.  EKG was obtained that showed normal sinus rhythm without ischemic changes; compared to 05/10/2015 tracing she now has a prolonged QT with QTc 486, nonspecific ST changes in leads III, aVL, aVF and later R wave progression.  We discussed her abnormalities on EKG and she was encouraged to discontinue her cyclobenzaprine .  She reports having a cardiologist that she follows up with regularly and recommend that she call them to schedule an appointment.  If she is unable to see them she was given the cardiology clinic on call.  She tested positive for influenza but was not started on Tamiflu  due to concern for QT prolongation.  Initially prescribed Tamiflu ,  however, after EKG showed prolonged QT this was discontinued with the pharmacy.  She was given Tessalon  for cough.  Recommended over-the-counter medication for symptom management.  Chest x-ray was obtained that showed no acute cardiopulmonary disease.  We discussed that if her symptoms are not improving or if anything worsens and she has high fever, worsening cough, shortness of breath, chest pain, palpitations, lightheadedness, passing out, weakness she needs to go to the ER immediately.  Strict return precautions given.  Final Clinical Impressions(s) / UC Diagnoses   Final diagnoses:  Acute cough  Influenza A  Atypical chest pain  Nonspecific abnormal electrocardiogram (ECG) (EKG)     Discharge Instructions      You tested positive for influenza A.  Use Tessalon  for cough.  You can use over-the-counter medication including Tylenol , ibuprofen , Mucinex, nasal saline/sinus rinses.  Your chest x-ray was normal.  Your EKG was slightly abnormal so I would like you to follow-up with cardiology; call to schedule an appointment.  Stop the cyclobenzaprine  (muscle relaxer).  If anything worsens and you have high fever, worsening cough, shortness of breath, chest pain, palpitations, lightheadedness, passing out you need to go to the emergency room immediately.  Follow-up with your primary care soon as possible.      ED Prescriptions     Medication Sig Dispense Auth. Provider   benzonatate  (TESSALON ) 100 MG capsule Take 1 capsule (100 mg total) by mouth every 8 (eight) hours. 21 capsule Kason Benak K, PA-C   oseltamivir  (TAMIFLU ) 75 MG capsule  (Status: Discontinued) Take 1 capsule (75 mg total) by mouth 2 (two) times daily. 10 capsule Alexei Doswell K, PA-C      PDMP not reviewed this encounter.   Sherrell Rocky POUR, PA-C 11/21/23 1129

## 2023-11-21 NOTE — ED Triage Notes (Signed)
 Patient states she has been coughing and having bilateral rib cage pain x 2 days.  Patient states she has been taking Coricidin.

## 2023-11-21 NOTE — Discharge Instructions (Addendum)
 You tested positive for influenza A.  Use Tessalon  for cough.  You can use over-the-counter medication including Tylenol , ibuprofen , Mucinex, nasal saline/sinus rinses.  Your chest x-ray was normal.  Your EKG was slightly abnormal so I would like you to follow-up with cardiology; call to schedule an appointment.  Stop the cyclobenzaprine  (muscle relaxer).  If anything worsens and you have high fever, worsening cough, shortness of breath, chest pain, palpitations, lightheadedness, passing out you need to go to the emergency room immediately.  Follow-up with your primary care soon as possible.

## 2024-02-06 ENCOUNTER — Other Ambulatory Visit: Payer: Self-pay | Admitting: Internal Medicine

## 2024-04-21 ENCOUNTER — Encounter: Payer: Self-pay | Admitting: Internal Medicine

## 2024-05-14 ENCOUNTER — Other Ambulatory Visit: Payer: Self-pay | Admitting: Internal Medicine

## 2024-05-18 ENCOUNTER — Other Ambulatory Visit (HOSPITAL_COMMUNITY): Payer: Self-pay

## 2024-05-18 ENCOUNTER — Other Ambulatory Visit: Payer: Self-pay

## 2024-05-18 ENCOUNTER — Encounter: Payer: Self-pay | Admitting: Cardiology

## 2024-05-18 ENCOUNTER — Ambulatory Visit: Payer: Self-pay | Attending: Cardiology | Admitting: Cardiology

## 2024-05-18 VITALS — BP 138/76 | HR 62 | Ht 62.0 in | Wt 181.8 lb

## 2024-05-18 DIAGNOSIS — E782 Mixed hyperlipidemia: Secondary | ICD-10-CM

## 2024-05-18 DIAGNOSIS — I10 Essential (primary) hypertension: Secondary | ICD-10-CM | POA: Diagnosis not present

## 2024-05-18 MED ORDER — ATORVASTATIN CALCIUM 40 MG PO TABS
40.0000 mg | ORAL_TABLET | Freq: Every evening | ORAL | 1 refills | Status: AC
  Filled 2024-05-18: qty 90, 90d supply, fill #0

## 2024-05-18 NOTE — Progress Notes (Signed)
 Cardiology Office Note:  .   Date:  05/18/2024  ID:  Sheryl Martin, DOB [birth year], MRN [id number] PCP: Adella Norris, MD  [facility] Providers Cardiologist:  Newman Lawrence, MD PCP: Adella Norris, MD  Chief Complaint  Patient presents with   Atypical chest pain     Sheryl Martin is a 58 y.o. female with hypertension, hyperlipidemia, chest pain  Discussed the use of AI scribe software for clinical note transcription with the patient, who gave verbal consent to proceed.  History of Present Illness Sheryl Martin is a 58 year old female with hypertension and hyperlipidemia who presents for a cardiac evaluation.  She experienced an episode of the flu in March 2025, which led to a referral for cardiac evaluation. She is currently not experiencing chest pain or dyspnea on exertion but feels slightly fatigued when walking. She is very active, with her job requiring extensive walking.  She has hypertension and hyperlipidemia, previously managed with Lipitor, which she has not taken for two months. Her last cholesterol check was in April 2024.  Her sister has hypertension. She does not smoke or consume alcohol.  She has had a persistent cough since May 2025, attributed to a cold, with no other symptoms reported.      Vitals:   05/18/24 1348  BP: 138/76  Pulse: 62  SpO2: 96%      Review of Systems  Cardiovascular:  Negative for chest pain, dyspnea on exertion, leg swelling, palpitations and syncope.        Studies Reviewed: SABRA        EKG 05/18/2024: Sinus rhythm 53 bpm Normal ECG When compared with ECG of 21-Nov-2023 10:30, Vent. rate has decreased BY  30 BPM Left axis deviation absent       Physical Exam Vitals and nursing note reviewed.  Constitutional:      General: She is not in acute distress. Neck:     Vascular: No JVD.  Cardiovascular:     Rate and Rhythm: Normal rate and regular rhythm.     Heart sounds: Normal heart  sounds. No murmur heard. Pulmonary:     Effort: Pulmonary effort is normal.     Breath sounds: Normal breath sounds. No wheezing or rales.      VISIT DIAGNOSES:   ICD-10-CM   1. Essential hypertension  I10 EKG 12-Lead    2. Mixed hyperlipidemia  E78.2 EKG 12-Lead    CT CARDIAC SCORING (SELF PAY ONLY)    Lipid panel       Sheryl Martin is a 58 y.o. female with hypertension, hyperlipidemia Assessment & Plan Chest pain: This was the reason for referral.  However, chest pain occurred during her flu illness in March.  She has not had any recurrence of chest pain since then.  No indication for ischemia testing at this time.  Given patient's hypertension and hyperlipidemia, for which she is currently not taking statin, recommend calcium  score scan for risk stratification.  Recommend heart healthy diet and lifestyle.  Hypertension: Well-controlled on amlodipine .  Mixed hyperlipidemia: Cholesterol high last April, off Lipitor for two months. Emphasized importance of monitoring cholesterol. Planned CT scan for calcium  score to assess coronary artery calcification. Further testing like stress test considered if abnormalities found. - Order lipid panel. - Prescribe Lipitor 40 mg, 90 pills with one refill. - Order CT scan for calcium  score. - If calcium  score shows significant abnormality, consider stress test. - If tests show no significant abnormality, continue follow-up with primary care. - If  tests show abnormality, discuss further management.   Meds ordered this encounter  Medications   atorvastatin  (LIPITOR) 40 MG tablet    Sig: Take 1 tablet (40 mg total) by mouth every evening with a meal.    Dispense:  90 tablet    Refill:  1    She failed the 40 mg of Simvastatin  which is equivalent to 20 mg of Atorvastatin  and she is also taking amlodipine  so cannot go higher with Simvastatin .     F/u as needed  Signed, Newman JINNY Lawrence, MD

## 2024-05-18 NOTE — Patient Instructions (Addendum)
 Medication Instructions:  Refill on your Atorvastatin  (Lipitor)  Lab Work: Lipid panel today   Testing/Procedures: Calcium  score   CT scanning for a cardiac calcium  score (CAT scanning), is a noninvasive, special x-ray that produces cross-sectional images of the body using x-rays and a computer. CT scans help physicians diagnose and treat medical conditions. For some CT exams, a contrast material is used to enhance visibility in the area of the body being studied. CT scans provide greater clarity and reveal more details than regular x-ray exams.   This is not covered by insurance and will be an out-of-pocket cost of approximately $99.   Follow-Up:  Your next appointment:   As needed  Provider:   Dr. Elmira

## 2024-05-19 ENCOUNTER — Ambulatory Visit: Payer: Self-pay

## 2024-05-19 LAB — LIPID PANEL
Chol/HDL Ratio: 4 ratio (ref 0.0–4.4)
Cholesterol, Total: 235 mg/dL — ABNORMAL HIGH (ref 100–199)
HDL: 59 mg/dL (ref >39)
LDL Chol Calc (NIH): 141 mg/dL — ABNORMAL HIGH (ref 0–99)
Triglycerides: 198 mg/dL — ABNORMAL HIGH (ref 0–149)
VLDL Cholesterol Cal: 35 mg/dL (ref 5–40)

## 2024-05-19 NOTE — Progress Notes (Signed)
 Cholesterol is elevated.  Continue Lipitor 40 mg prescribed at yesterday's visit.  Follow-up with PCP recommended.  Thanks MJP

## 2024-05-22 NOTE — Telephone Encounter (Signed)
 Patient returned RN's call regarding results.  Patient stated she is at work and can reach her after 4:00 pm.

## 2024-05-23 ENCOUNTER — Other Ambulatory Visit: Payer: Self-pay | Admitting: Internal Medicine

## 2024-06-08 ENCOUNTER — Ambulatory Visit (HOSPITAL_COMMUNITY)

## 2024-06-25 ENCOUNTER — Encounter (HOSPITAL_BASED_OUTPATIENT_CLINIC_OR_DEPARTMENT_OTHER): Payer: Self-pay

## 2024-06-25 ENCOUNTER — Ambulatory Visit (HOSPITAL_BASED_OUTPATIENT_CLINIC_OR_DEPARTMENT_OTHER): Payer: Self-pay

## 2024-07-13 ENCOUNTER — Ambulatory Visit (HOSPITAL_COMMUNITY)
Admission: EM | Admit: 2024-07-13 | Discharge: 2024-07-13 | Disposition: A | Discharge status: Home / Self Care | Attending: Physician Assistant | Admitting: Physician Assistant

## 2024-07-13 ENCOUNTER — Encounter (HOSPITAL_COMMUNITY): Payer: Self-pay

## 2024-07-13 DIAGNOSIS — R0789 Other chest pain: Secondary | ICD-10-CM | POA: Diagnosis not present

## 2024-07-13 DIAGNOSIS — R079 Chest pain, unspecified: Secondary | ICD-10-CM | POA: Diagnosis not present

## 2024-07-13 DIAGNOSIS — K2100 Gastro-esophageal reflux disease with esophagitis, without bleeding: Secondary | ICD-10-CM

## 2024-07-13 MED ORDER — LIDOCAINE VISCOUS HCL 2 % MT SOLN
15.0000 mL | Freq: Once | OROMUCOSAL | Status: AC
  Administered 2024-07-13: 15 mL via OROMUCOSAL

## 2024-07-13 MED ORDER — ALUM & MAG HYDROXIDE-SIMETH 200-200-20 MG/5ML PO SUSP
ORAL | Status: AC
  Filled 2024-07-13: qty 30

## 2024-07-13 MED ORDER — ALUM & MAG HYDROXIDE-SIMETH 200-200-20 MG/5ML PO SUSP
30.0000 mL | Freq: Once | ORAL | Status: AC
  Administered 2024-07-13: 30 mL via ORAL

## 2024-07-13 MED ORDER — SUCRALFATE 1 G PO TABS: 1.0000 g | ORAL_TABLET | Freq: Three times a day (TID) | ORAL | 0 refills | Status: AC

## 2024-07-13 MED ORDER — LIDOCAINE VISCOUS HCL 2 % MT SOLN
OROMUCOSAL | Status: AC
  Filled 2024-07-13: qty 15

## 2024-07-13 NOTE — ED Triage Notes (Signed)
 Pt presents with complaints with chest pain in the middle of her chest. Reports pain feels like burning sensation. Pt has tried medication that normally helps and it hasn't helped. Pt denies any other symptoms other than muscle spasm in sides of her back.

## 2024-07-13 NOTE — ED Provider Notes (Signed)
 [facility]    CSN: [id number] Arrival date & time: 07/13/24  1141      History   Chief Complaint Chief Complaint  Patient presents with   Chest Pain    HPI Sheryl Martin is a 58 y.o. female.   Patient presents today with a 3 to 4-week history of atypical chest pain.  She reports that pain is worse following eating and described as a burning sensation in her substernal region.  She does have a history of GERD but has been taking her Pepcid  as well as over-the-counter reflux medications without improvement of symptoms.  She has not seen a GI specialist or had an endoscopy.  Denies any recent medication changes including increased NSAID use.  She did have a history of hyperlipidemia and hypertension but denies diagnosis of CAD, diabetes, smoking, family history of cardiovascular disease.  She denies any melena, medic easy, hematemesis.  Reports pain is rated 5 on a 0-10 certain pain scale, described as burning, no alleviating factors identified.  She does report that the acid reflux medication improves her symptoms temporarily but then they come back.  Denies any associated symptoms including palpitations, lightheadedness, syncope, shortness of breath, nausea/vomiting, diaphoresis.    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 Authorizing Provider  sucralfate (CARAFATE) 1 g tablet Take 1 tablet (1 g total) by mouth with breakfast, with lunch, and with evening meal. 07/13/24  Yes Lashanda Storlie K, PA-C  amLODipine  (NORVASC ) 10 MG tablet TAKE 1 TABLET BY MOUTH ONCE DAILY . APPOINTMENT REQUIRED FOR FUTURE REFILLS 05/15/24   Adella Norris, MD  atorvastatin  (LIPITOR) 40 MG tablet Take 1 tablet (40 mg total) by mouth every evening with a meal. 05/18/24   Patwardhan, Manish J, MD  Calcium  Citrate 250 MG TABS 1 tab by mouth daily. 11/19/22   Adella Norris, MD  famotidine  (PEPCID ) 20 MG tablet TAKE 2 TABLETS BY MOUTH AT BEDTIME 11/11/23   Adella Norris, MD  fluticasone  (FLONASE ) 50 MCG/ACT nasal spray Use 2 spray(s) in each nostril once daily 05/26/24   Adella Norris, MD  gabapentin  (NEURONTIN ) 100 MG capsule Take 1 capsule  by mouth at bedtime 03/18/23   Adella Norris, MD  loratadine  (CLARITIN ) 10 MG tablet Take 1 tablet (10 mg total) by mouth daily. 07/11/22   Adella Norris, MD  Multiple Vitamins-Minerals (CENTRUM SILVER 50+WOMEN) TABS Take 1 tablet by mouth daily.    [provider]  naproxen  (NAPROSYN ) 500 MG tablet Take 1 tablet by mouth 2 (two) times daily.    [provider]  Olopatadine  HCl 0.2 % SOLN 1 drop each eye daily as needed for allergies 11/13/21   Adella Norris, MD    Family History Family History  Problem Relation Age of Onset   Hypertension Mother    Stroke Mother    Cancer Father        lung cancer--smoker   Hypertension Sister    Diabetes Sister    Hypertension Sister        peripheral  neuropathy   Peripheral vascular disease Sister    Hypertension Sister    Seizures Sister    Cancer Sister        Breast cancer many years ago   Peripheral vascular disease Sister    Diabetes Sister        complications of foot amputation.   Hypertension Sister    Dementia Sister        mild   Hypertension Sister    Hypertension Sister    Hypertension Sister    Diabetes Sister    Hypertension Brother    Diabetes Brother    Hypertension Brother    Alcohol abuse Brother    Cirrhosis Brother    Hypertension Brother    Alcohol abuse Brother    Hypertension Brother    Seizures Brother    Stroke Brother        cause of death   Stroke Brother    Hypertension Brother    Hypertension Brother    Colon cancer Neg Hx    Colon polyps Neg Hx    Stomach cancer Neg Hx    Esophageal cancer Neg Hx    Rectal cancer Neg Hx     Social History Social History   Tobacco Use   Smoking status: Never    Passive exposure: Never   Smokeless tobacco: Never  Vaping Use   Vaping status: Never Used  Substance Use Topics   Alcohol use: Yes    Comment: rare   Drug use: No     Allergies   Robaxin  [methocarbamol ]   Review of Systems Review of Systems  Constitutional:  Positive for activity change. Negative for appetite change, fatigue and fever.  Respiratory:  Negative for cough and shortness of breath.   Cardiovascular:  Positive for chest pain. Negative for palpitations and leg swelling.  Gastrointestinal:  Negative for abdominal pain, diarrhea, nausea and vomiting.  Neurological:  Negative for dizziness, light-headedness and headaches.     Physical Exam Triage Vital Signs ED Triage Vitals  Encounter Vitals Group     BP 07/13/24 1151 112/81     Girls Systolic BP Percentile --      Girls Diastolic BP Percentile --      Boys Systolic BP Percentile --      Boys Diastolic BP Percentile --      Pulse Rate 07/13/24 1151 66     Resp 07/13/24 1151 18     Temp 07/13/24 1151 98.3 F  (36.8 C)     Temp src --      SpO2 07/13/24 1151 100 %     Weight --  Height --      Head Circumference --      Peak Flow --      Pain Score 07/13/24 1150 5     Pain Loc --      Pain Education --      Exclude from Growth Chart --    No data found.  Updated Vital Signs BP 112/81   Pulse 66   Temp 98.3 F (36.8 C)   Resp 18   LMP 12/14/2006 (Approximate)   SpO2 100%   Visual Acuity Right Eye Distance:   Left Eye Distance:   Bilateral Distance:    Right Eye Near:   Left Eye Near:    Bilateral Near:     Physical Exam Vitals reviewed.  Constitutional:      General: She is awake. She is not in acute distress.    Appearance: Normal appearance. She is well-developed. She is not ill-appearing.     Comments: Very pleasant female stated age in no acute distress sitting comfortably in exam room  HENT:     Head: Normocephalic and atraumatic.  Cardiovascular:     Rate and Rhythm: Normal rate and regular rhythm.     Heart sounds: Normal heart sounds, S1 normal and S2 normal. No murmur heard. Pulmonary:     Effort: Pulmonary effort is normal.     Breath sounds: Normal breath sounds. No wheezing, rhonchi or rales.     Comments: Clear to auscultation bilaterally Chest:     Chest wall: No deformity, swelling or tenderness.     Comments: Pain is not reproducible on exam Abdominal:     General: Bowel sounds are normal.     Palpations: Abdomen is soft.     Tenderness: There is no abdominal tenderness. There is no right CVA tenderness, left CVA tenderness, guarding or rebound.     Comments: Benign abdominal exam  Psychiatric:        Behavior: Behavior is cooperative.      UC Treatments / Results  Labs (all labs ordered are listed, but only abnormal results are displayed) Labs Reviewed - No data to display  EKG   Radiology No results found.  Procedures Procedures (including critical care time)  Medications Ordered in UC Medications  alum & mag  hydroxide-simeth (MAALOX/MYLANTA) 200-200-20 MG/5ML suspension 30 mL (30 mLs Oral Given 07/13/24 1207)  lidocaine (XYLOCAINE) 2 % viscous mouth solution 15 mL (15 mLs Mouth/Throat Given 07/13/24 1207)    Initial Impression / Assessment and Plan / UC Course  I have reviewed the triage vital signs and the nursing notes.  Pertinent labs & imaging results that were available during my care of the patient were reviewed by me and considered in my medical decision making (see chart for details).     Patient is well-appearing, afebrile, nontoxic, nontachycardic.  EKG was obtained that showed normal sinus rhythm with ventricular rate of 61 bpm without ischemic changes; compared to 05/18/2024 tracing and no significant change.  Low suspicion for CAD given INTERCHEST score of 0.  Patient was given a GI cocktail with resolution of symptoms and we discussed GERD as likely cause of symptoms.  She was encouraged to continue over-the-counter and previously recommended H2 blockade and PPIs.  Will also add Carafate before meals for the next week in the hopes this would provide improvement of symptoms.  She is to use dietary and lifestyle modification for additional symptom relief and recommend she avoid alcohol as well as NSAIDs.  We discussed that  if anything worsens or changes she should be seen immediately.  Strict return precautions given.  Recommend close follow-up with primary care.  All questions answered to patient satisfaction.  Excuse note provided.  Final Clinical Impressions(s) / UC Diagnoses   Final diagnoses:  Gastroesophageal reflux disease with esophagitis, unspecified whether hemorrhage  Atypical chest pain     Discharge Instructions      I am glad that you have been better after the medication.  I believe that your acid reflux is contributing to your symptoms.  Use sucralfate before each meal for the next week.  Continue your acid reflux medication.  Avoid alcohol and NSAIDs (aspirin,  ibuprofen /Advil , naproxen /Aleve ).  Avoid spicy/acidic foods.  If your symptoms are not improving follow-up with GI specialist; call to schedule appointment.  If anything worsens and you have additional or changing chest pain, associated shortness of breath, nausea/vomiting, weakness, passing out, blood in your stool, dark black stool you need to go to the emergency room immediately.    ED Prescriptions     Medication Sig Dispense Auth. Provider   sucralfate (CARAFATE) 1 g tablet Take 1 tablet (1 g total) by mouth with breakfast, with lunch, and with evening meal. 21 tablet Dessire Grimes K, PA-C      PDMP not reviewed this encounter.   Sherrell Rocky POUR, PA-C 07/13/24 1400

## 2024-07-13 NOTE — Discharge Instructions (Signed)
 I am glad that you have been better after the medication.  I believe that your acid reflux is contributing to your symptoms.  Use sucralfate before each meal for the next week.  Continue your acid reflux medication.  Avoid alcohol and NSAIDs (aspirin, ibuprofen /Advil , naproxen /Aleve ).  Avoid spicy/acidic foods.  If your symptoms are not improving follow-up with GI specialist; call to schedule appointment.  If anything worsens and you have additional or changing chest pain, associated shortness of breath, nausea/vomiting, weakness, passing out, blood in your stool, dark black stool you need to go to the emergency room immediately.

## 2024-07-22 ENCOUNTER — Other Ambulatory Visit: Payer: Self-pay | Admitting: Internal Medicine

## 2024-09-12 ENCOUNTER — Other Ambulatory Visit: Payer: Self-pay

## 2024-09-12 ENCOUNTER — Emergency Department (HOSPITAL_COMMUNITY)
Admission: EM | Admit: 2024-09-12 | Discharge: 2024-09-12 | Disposition: A | Discharge status: Home / Self Care | Attending: Emergency Medicine | Admitting: Emergency Medicine

## 2024-09-12 ENCOUNTER — Emergency Department (HOSPITAL_COMMUNITY)

## 2024-09-12 ENCOUNTER — Encounter (HOSPITAL_COMMUNITY): Payer: Self-pay | Admitting: *Deleted

## 2024-09-12 DIAGNOSIS — E876 Hypokalemia: Secondary | ICD-10-CM | POA: Insufficient documentation

## 2024-09-12 DIAGNOSIS — D72829 Elevated white blood cell count, unspecified: Secondary | ICD-10-CM | POA: Insufficient documentation

## 2024-09-12 DIAGNOSIS — R1013 Epigastric pain: Secondary | ICD-10-CM | POA: Diagnosis not present

## 2024-09-12 DIAGNOSIS — R059 Cough, unspecified: Secondary | ICD-10-CM | POA: Diagnosis not present

## 2024-09-12 DIAGNOSIS — R072 Precordial pain: Secondary | ICD-10-CM | POA: Diagnosis present

## 2024-09-12 LAB — BASIC METABOLIC PANEL WITH GFR
Anion gap: 13 (ref 5–15)
BUN: 15 mg/dL (ref 6–20)
CO2: 21 mmol/L — ABNORMAL LOW (ref 22–32)
Calcium: 9.2 mg/dL (ref 8.9–10.3)
Chloride: 103 mmol/L (ref 98–111)
Creatinine, Ser: 0.78 mg/dL (ref 0.44–1.00)
GFR, Estimated: >60 mL/min (ref >60)
Glucose, Bld: 106 mg/dL — ABNORMAL HIGH (ref 70–99)
Potassium: 3.3 mmol/L — ABNORMAL LOW (ref 3.5–5.1)
Sodium: 137 mmol/L (ref 135–145)

## 2024-09-12 LAB — CBC
HCT: 38.2 % (ref 36.0–46.0)
Hemoglobin: 12.1 g/dL (ref 12.0–15.0)
MCH: 28.4 pg (ref 26.0–34.0)
MCHC: 31.7 g/dL (ref 30.0–36.0)
MCV: 89.7 fL (ref 80.0–100.0)
Platelets: 305 K/uL (ref 150–400)
RBC: 4.26 MIL/uL (ref 3.87–5.11)
RDW: 13.8 % (ref 11.5–15.5)
WBC: 15.3 K/uL — ABNORMAL HIGH (ref 4.0–10.5)
nRBC: 0 % (ref 0.0–0.2)

## 2024-09-12 LAB — HEPATIC FUNCTION PANEL
ALT: 37 U/L (ref 0–44)
AST: 48 U/L — ABNORMAL HIGH (ref 15–41)
Albumin: 3.9 g/dL (ref 3.5–5.0)
Alkaline Phosphatase: 69 U/L (ref 38–126)
Bilirubin, Direct: 0.1 mg/dL (ref 0.0–0.2)
Indirect Bilirubin: 0.7 mg/dL (ref 0.3–0.9)
Total Bilirubin: 0.8 mg/dL (ref 0.0–1.2)
Total Protein: 6.9 g/dL (ref 6.5–8.1)

## 2024-09-12 LAB — TROPONIN I (HIGH SENSITIVITY)
Troponin I (High Sensitivity): 3 ng/L (ref <18)
Troponin I (High Sensitivity): 4 ng/L (ref <18)

## 2024-09-12 LAB — LIPASE, BLOOD: Lipase: 41 U/L (ref 11–51)

## 2024-09-12 LAB — RESP PANEL BY RT-PCR (RSV, FLU A&B, COVID)  RVPGX2
Influenza A by PCR: NEGATIVE
Influenza B by PCR: NEGATIVE
Resp Syncytial Virus by PCR: NEGATIVE
SARS Coronavirus 2 by RT PCR: NEGATIVE

## 2024-09-12 MED ORDER — ALUM & MAG HYDROXIDE-SIMETH 200-200-20 MG/5ML PO SUSP
30.0000 mL | Freq: Once | ORAL | Status: AC
  Administered 2024-09-12: 30 mL via ORAL
  Filled 2024-09-12: qty 30

## 2024-09-12 MED ORDER — MORPHINE SULFATE (PF) 4 MG/ML IV SOLN
4.0000 mg | Freq: Once | INTRAVENOUS | Status: AC
  Administered 2024-09-12: 4 mg via INTRAVENOUS
  Filled 2024-09-12: qty 1

## 2024-09-12 MED ORDER — POTASSIUM CHLORIDE CRYS ER 20 MEQ PO TBCR
40.0000 meq | EXTENDED_RELEASE_TABLET | Freq: Once | ORAL | Status: AC
Start: 2024-09-12 — End: 2024-09-12
  Administered 2024-09-12: 40 meq via ORAL
  Filled 2024-09-12: qty 2

## 2024-09-12 NOTE — ED Provider Notes (Signed)
 [facility] Emergency Department Provider Note MRN:  [id number]  Arrival date & time: 09/12/24     Chief Complaint   Chest Pain   History of Present Illness   Sheryl Martin is a 58 y.o. year-old female presents to the ED with chief complaint of chest pain/epigastric pain.  States that the pain took her breath away, but she is feeling good now.  She states that she has bad acid reflux along with muscle spasms in her back.  She states that she has had slight cough, but denies fever.  Didn't try taking anything for the symptoms.  History provided by patient.   Review of Systems  Pertinent positive and negative review of systems noted in HPI.    Physical Exam   Vitals:   09/12/24 0300 09/12/24 0330  BP:    Pulse: (!) 58 (!) 54  Resp: 17 15  Temp:    SpO2: 99% 98%    CONSTITUTIONAL:  non toxic-appearing, NAD NEURO:  Alert and oriented x 3, CN 3-12 grossly intact EYES:  eyes equal and reactive ENT/NECK:  Supple, no stridor  CARDIO:  normal rate, regular rhythm, appears well-perfused  PULM:  No respiratory distress, CTAB GI/GU:  non-distended, no focal abdominal tenderness MSK/SPINE:  No gross deformities, no edema, moves all extremities  SKIN:  no rash,  atraumatic   *Additional and/or pertinent findings included in MDM below  Diagnostic and Interventional Summary    EKG Interpretation Date/Time:  Saturday September 12 2024 00:46:52 EST Ventricular Rate:  69 PR Interval:  140 QRS Duration:  84 QT Interval:  432 QTC Calculation: 462 R Axis:   62  Text Interpretation: Normal sinus rhythm Possible Anterior infarct , age undetermined Abnormal ECG Interpretation limited secondary to artifact No significant change since last tracing Confirmed by Midge Golas ([id number]) on 09/12/2024 1:42:41 AM       Labs Reviewed  BASIC METABOLIC PANEL WITH GFR - Abnormal; Notable for the following components:      Result Value   Potassium 3.3 (*)    CO2  21 (*)    Glucose, Bld 106 (*)    All other components within normal limits  CBC - Abnormal; Notable for the following components:   WBC 15.3 (*)    All other components within normal limits  HEPATIC FUNCTION PANEL - Abnormal; Notable for the following components:   AST 48 (*)    All other components within normal limits  RESP PANEL BY RT-PCR (RSV, FLU A&B, COVID)  [id number]  LIPASE, BLOOD  TROPONIN I (HIGH SENSITIVITY)  TROPONIN I (HIGH SENSITIVITY)    DG Chest 2 View  Final Result      Medications  alum & mag hydroxide-simeth (MAALOX/MYLANTA) 200-200-20 MG/5ML suspension 30 mL (30 mLs Oral Given 09/12/24 0228)  morphine (PF) 4 MG/ML injection 4 mg (4 mg Intravenous Given 09/12/24 0227)  potassium chloride SA (KLOR-CON M) CR tablet 40 mEq (40 mEq Oral Given 09/12/24 0228)     Procedures  /  Critical Care Procedures  ED Course and Medical Decision Making  I have reviewed the triage vital signs, the nursing notes, and pertinent available records from the EMR.  Social Determinants Affecting Complexity of Care: Patient has no clinically significant social determinants affecting this chief complaint..   ED Course:    Medical Decision Making Patient here with epigastric pain and chest pain.  States that she has bad acid reflux as well as back spasms.  States that her symptoms resolved  by the time she had arrived in the emergency department.  She denies history of heart problems.  She does have history of hypertension and hyperlipidemia.  Chest x-ray negative.  Mild hypokalemia, treated with p.o. potassium.  Suspicion for PE is low.  EKG is without any acute ischemic changes.  Troponins are negative x 2, suspicion for ACS is low.  Given that symptoms have resolved and patient has reassuring workup, I feel that she can be safely discharged.  Recommend outpatient PCP follow-up.  Amount and/or Complexity of Data Reviewed Labs: ordered. Radiology: ordered.  Risk OTC  drugs. Prescription drug management.         Consultants: No consultations were needed in caring for this patient.   Treatment and Plan: I considered admission due to patient's initial presentation, but after considering the examination and diagnostic results, patient will not require admission and can be discharged with outpatient follow-up.    Final Clinical Impressions(s) / ED Diagnoses     ICD-10-CM   1. Precordial chest pain  R07.2       ED Discharge Orders     None         Discharge Instructions Discussed with and Provided to Patient:   Discharge Instructions   None      Vicky Charleston, PA-C 09/12/24 0351    Midge Golas, MD 09/12/24 [phone]

## 2024-09-12 NOTE — ED Triage Notes (Signed)
 The pt started having chest pain and or epigastric pain around 2200 tonight she reports that she is not short of breath but hyperventilating  no previous history of cardiac problems but she has reflux that she takes med to control.

## 2024-11-12 ENCOUNTER — Encounter: Payer: Self-pay | Admitting: Internal Medicine

## 2025-05-20 ENCOUNTER — Encounter: Payer: Self-pay | Admitting: Internal Medicine
